# Patient Record
Sex: Male | Born: 1985 | Race: Black or African American | Hispanic: No | Marital: Married | Smoking: Current every day smoker
Health system: Southern US, Community
[De-identification: ages and names within clinical notes are randomized; demographics above are authoritative.]

## PROBLEM LIST (undated history)

## (undated) DIAGNOSIS — Z789 Other specified health status: Secondary | ICD-10-CM

---

## 2012-08-31 ENCOUNTER — Emergency Department (HOSPITAL_COMMUNITY): Payer: No Typology Code available for payment source

## 2012-08-31 ENCOUNTER — Encounter (HOSPITAL_COMMUNITY): Payer: Self-pay | Admitting: Emergency Medicine

## 2012-08-31 ENCOUNTER — Other Ambulatory Visit (HOSPITAL_COMMUNITY): Payer: Self-pay | Admitting: Emergency Medicine

## 2012-08-31 ENCOUNTER — Ambulatory Visit (HOSPITAL_COMMUNITY)
Admission: RE | Admit: 2012-08-31 | Discharge: 2012-08-31 | Disposition: A | Discharge status: Home / Self Care | Payer: No Typology Code available for payment source | Source: Ambulatory Visit | Attending: Emergency Medicine | Admitting: Emergency Medicine

## 2012-08-31 ENCOUNTER — Emergency Department (HOSPITAL_COMMUNITY)
Admission: EM | Admit: 2012-08-31 | Discharge: 2012-08-31 | Disposition: A | Discharge status: Home / Self Care | Payer: No Typology Code available for payment source | Attending: Emergency Medicine | Admitting: Emergency Medicine

## 2012-08-31 DIAGNOSIS — R05 Cough: Secondary | ICD-10-CM

## 2012-08-31 DIAGNOSIS — Y93I9 Activity, other involving external motion: Secondary | ICD-10-CM | POA: Insufficient documentation

## 2012-08-31 DIAGNOSIS — S161XXA Strain of muscle, fascia and tendon at neck level, initial encounter: Secondary | ICD-10-CM

## 2012-08-31 DIAGNOSIS — Y92410 Unspecified street and highway as the place of occurrence of the external cause: Secondary | ICD-10-CM | POA: Insufficient documentation

## 2012-08-31 DIAGNOSIS — F17200 Nicotine dependence, unspecified, uncomplicated: Secondary | ICD-10-CM | POA: Insufficient documentation

## 2012-08-31 DIAGNOSIS — S139XXA Sprain of joints and ligaments of unspecified parts of neck, initial encounter: Secondary | ICD-10-CM | POA: Insufficient documentation

## 2012-08-31 DIAGNOSIS — S46909A Unspecified injury of unspecified muscle, fascia and tendon at shoulder and upper arm level, unspecified arm, initial encounter: Secondary | ICD-10-CM | POA: Insufficient documentation

## 2012-08-31 DIAGNOSIS — S4980XA Other specified injuries of shoulder and upper arm, unspecified arm, initial encounter: Secondary | ICD-10-CM | POA: Insufficient documentation

## 2012-08-31 MED ORDER — TRAMADOL HCL 50 MG PO TABS: 50.0000 mg | ORAL_TABLET | Freq: Four times a day (QID) | ORAL | Status: DC | PRN

## 2012-08-31 MED ORDER — IBUPROFEN 800 MG PO TABS
800.0000 mg | ORAL_TABLET | Freq: Once | ORAL | Status: AC
  Administered 2012-08-31: 800 mg via ORAL
  Filled 2012-08-31: qty 1

## 2012-09-02 MED FILL — Acetaminophen Tab 325 MG: ORAL | Qty: 3 | Status: AC

## 2012-11-09 ENCOUNTER — Emergency Department (HOSPITAL_BASED_OUTPATIENT_CLINIC_OR_DEPARTMENT_OTHER)
Admission: EM | Admit: 2012-11-09 | Discharge: 2012-11-09 | Disposition: A | Discharge status: Home / Self Care | Payer: Self-pay | Attending: Emergency Medicine | Admitting: Emergency Medicine

## 2012-11-09 ENCOUNTER — Encounter (HOSPITAL_BASED_OUTPATIENT_CLINIC_OR_DEPARTMENT_OTHER): Payer: Self-pay | Admitting: *Deleted

## 2012-11-09 DIAGNOSIS — A599 Trichomoniasis, unspecified: Secondary | ICD-10-CM | POA: Insufficient documentation

## 2012-11-09 DIAGNOSIS — F17200 Nicotine dependence, unspecified, uncomplicated: Secondary | ICD-10-CM | POA: Insufficient documentation

## 2012-11-09 MED ORDER — METRONIDAZOLE 500 MG PO TABS
2000.0000 mg | ORAL_TABLET | Freq: Once | ORAL | Status: AC
  Administered 2012-11-09: 2000 mg via ORAL
  Filled 2012-11-09: qty 4

## 2012-11-09 NOTE — ED Notes (Signed)
MD at bedside. 

## 2012-11-09 NOTE — ED Provider Notes (Signed)
History     CSN: [id number]  Arrival date & time 11/09/12  0302   None     Chief Complaint  Patient presents with  . Exposure to STD     (Consider location/radiation/quality/duration/timing/severity/associated sxs/prior treatment) HPI This is a 9 or old male whose girlfriend was just tested positive for trichomoniasis here in the ED. He wishes to be treated. He is asymptomatic.  History reviewed. No pertinent past medical history.  History reviewed. No pertinent past surgical history.  No family history on file.  History  Substance Use Topics  . Smoking status: Current Every Day Smoker  . Smokeless tobacco: Not on file  . Alcohol Use: Yes      Review of Systems  All other systems reviewed and are negative.    Allergies  Review of patient's allergies indicates no known allergies.  Home Medications   Current Outpatient Rx  Name  Route  Sig  Dispense  Refill  . traMADol (ULTRAM) 50 MG tablet   Oral   Take 1 tablet (50 mg total) by mouth every 6 (six) hours as needed for pain.   15 tablet   0     BP 142/92  Pulse 66  Temp(Src) 98.4 F (36.9 C) (Oral)  Resp 18  Wt 190 lb (86.183 kg)  SpO2 98%  Physical Exam General: Well-developed, well-nourished male in no acute distress; appearance consistent with age of record HENT: normocephalic, atraumatic Eyes: Normal appearance Neck: supple Heart: regular rate and rhythm Lungs: Normal respiratory effort and excursion Abdomen: soft; nondistended; nontender GU: Tanner 4 male, circumcised; no urethral discharge Extremities: No deformity; full range of motion Neurologic: Awake, alert; motor function intact in all extremities and symmetric; no facial droop Skin: Warm and dry Psychiatric: Flat affect    ED Course  Procedures (including critical care time)    MDM          Hanley Seamen, MD 11/09/12 [phone]

## 2012-11-09 NOTE — ED Notes (Signed)
Pt's girlfriend tested positive for trichomonas tonight and pt would like to be treated for same.

## 2012-11-10 LAB — GC/CHLAMYDIA PROBE AMP
CT Probe RNA: NEGATIVE
GC Probe RNA: NEGATIVE

## 2012-11-17 ENCOUNTER — Encounter (HOSPITAL_BASED_OUTPATIENT_CLINIC_OR_DEPARTMENT_OTHER): Payer: Self-pay | Admitting: *Deleted

## 2012-11-17 ENCOUNTER — Emergency Department (HOSPITAL_BASED_OUTPATIENT_CLINIC_OR_DEPARTMENT_OTHER)
Admission: EM | Admit: 2012-11-17 | Discharge: 2012-11-17 | Disposition: A | Discharge status: Home / Self Care | Payer: Self-pay | Attending: Emergency Medicine | Admitting: Emergency Medicine

## 2012-11-17 DIAGNOSIS — R05 Cough: Secondary | ICD-10-CM | POA: Insufficient documentation

## 2012-11-17 DIAGNOSIS — J309 Allergic rhinitis, unspecified: Secondary | ICD-10-CM | POA: Insufficient documentation

## 2012-11-17 DIAGNOSIS — F17200 Nicotine dependence, unspecified, uncomplicated: Secondary | ICD-10-CM | POA: Insufficient documentation

## 2012-11-17 DIAGNOSIS — R059 Cough, unspecified: Secondary | ICD-10-CM | POA: Insufficient documentation

## 2012-11-17 NOTE — ED Notes (Signed)
Pt states he has problems with his allergies, but they have been really bad today. No relief with OTC meds.

## 2012-11-17 NOTE — ED Provider Notes (Signed)
History     CSN: [id number]  Arrival date & time 11/17/12  0011   First MD Initiated Contact with Patient 11/17/12 0211      Chief Complaint  Patient presents with  . Allergies     The history is provided by the patient.  pt reports allergies Onset - 1 day ago Course - worsening Improved by - nothing Worsened by - nothing  Pt reports nasal congestion, sinus pressure, mild cough and lacrimation for one day Not responding to claritin  PMH - none  History reviewed. No pertinent past surgical history.  History reviewed. No pertinent family history.  History  Substance Use Topics  . Smoking status: Current Every Day Smoker  . Smokeless tobacco: Not on file  . Alcohol Use: Yes      Review of Systems  HENT: Positive for congestion.   Respiratory: Positive for cough.     Allergies  Review of patient's allergies indicates no known allergies.  Home Medications   Current Outpatient Rx  Name  Route  Sig  Dispense  Refill  . traMADol (ULTRAM) 50 MG tablet   Oral   Take 1 tablet (50 mg total) by mouth every 6 (six) hours as needed for pain.   15 tablet   0     BP 145/93  Pulse 67  Temp(Src) 98.1 F (36.7 C) (Oral)  Resp 18  Ht 5\' 10"  (1.778 m)  Wt 190 lb (86.183 kg)  BMI 27.26 kg/m2  SpO2 98%  Physical Exam CONSTITUTIONAL: Well developed/well nourished HEAD: Normocephalic/atraumatic EYES: EOMI/PERRL ENMT: Mucous membranes moist, nasal congestion noted.  Nasal mucosa boggy in appearance NECK: supple no meningeal signs CV: S1/S2 noted, no murmurs/rubs/gallops noted LUNGS: Lungs are clear to auscultation bilaterally, no apparent distress ABDOMEN: soft, nontender, no rebound or guarding NEURO: Pt is awake/alert, moves all extremitiesx4 EXTREMITIES: pulses normal, full ROM SKIN: warm, color normal PSYCH: no abnormalities of mood noted  ED Course  Procedures  1. Allergic rhinitis       MDM  Nursing notes including past medical history and social  history reviewed and considered in documentation  Recommend flonase, report he is already using without relief He requests a "shot" for his symptoms but I told him there was not a shot to be given Stable for d/c        Joya Gaskins, MD 11/17/12 [phone]

## 2013-07-08 ENCOUNTER — Emergency Department (HOSPITAL_BASED_OUTPATIENT_CLINIC_OR_DEPARTMENT_OTHER)
Admission: EM | Admit: 2013-07-08 | Discharge: 2013-07-08 | Disposition: A | Discharge status: Home / Self Care | Payer: Self-pay | Attending: Emergency Medicine | Admitting: Emergency Medicine

## 2013-07-08 ENCOUNTER — Encounter (HOSPITAL_BASED_OUTPATIENT_CLINIC_OR_DEPARTMENT_OTHER): Payer: Self-pay | Admitting: Emergency Medicine

## 2013-07-08 ENCOUNTER — Emergency Department (HOSPITAL_BASED_OUTPATIENT_CLINIC_OR_DEPARTMENT_OTHER): Payer: Self-pay

## 2013-07-08 DIAGNOSIS — J3489 Other specified disorders of nose and nasal sinuses: Secondary | ICD-10-CM | POA: Insufficient documentation

## 2013-07-08 DIAGNOSIS — IMO0001 Reserved for inherently not codable concepts without codable children: Secondary | ICD-10-CM | POA: Insufficient documentation

## 2013-07-08 DIAGNOSIS — J029 Acute pharyngitis, unspecified: Secondary | ICD-10-CM | POA: Insufficient documentation

## 2013-07-08 DIAGNOSIS — R52 Pain, unspecified: Secondary | ICD-10-CM | POA: Insufficient documentation

## 2013-07-08 DIAGNOSIS — B349 Viral infection, unspecified: Secondary | ICD-10-CM

## 2013-07-08 DIAGNOSIS — F17200 Nicotine dependence, unspecified, uncomplicated: Secondary | ICD-10-CM | POA: Insufficient documentation

## 2013-07-08 DIAGNOSIS — R630 Anorexia: Secondary | ICD-10-CM | POA: Insufficient documentation

## 2013-07-08 DIAGNOSIS — R509 Fever, unspecified: Secondary | ICD-10-CM | POA: Insufficient documentation

## 2013-07-08 DIAGNOSIS — B9789 Other viral agents as the cause of diseases classified elsewhere: Secondary | ICD-10-CM | POA: Insufficient documentation

## 2013-07-08 LAB — COMPREHENSIVE METABOLIC PANEL
ALT: 21 U/L (ref 0–53)
AST: 19 U/L (ref 0–37)
Albumin: 4.1 g/dL (ref 3.5–5.2)
CO2: 25 mEq/L (ref 19–32)
Calcium: 9.6 mg/dL (ref 8.4–10.5)
Chloride: 101 mEq/L (ref 96–112)
Creatinine, Ser: 1.2 mg/dL (ref 0.50–1.35)
Sodium: 137 mEq/L (ref 135–145)
Total Bilirubin: 0.3 mg/dL (ref 0.3–1.2)

## 2013-07-08 LAB — CBC WITH DIFFERENTIAL/PLATELET
Basophils Absolute: 0 10*3/uL (ref 0.0–0.1)
Basophils Relative: 0 % (ref 0–1)
HCT: 41.1 % (ref 39.0–52.0)
Lymphocytes Relative: 11 % — ABNORMAL LOW (ref 12–46)
MCHC: 35 g/dL (ref 30.0–36.0)
Monocytes Absolute: 1.2 10*3/uL — ABNORMAL HIGH (ref 0.1–1.0)
Neutro Abs: 12.8 10*3/uL — ABNORMAL HIGH (ref 1.7–7.7)
Platelets: 220 10*3/uL (ref 150–400)
RDW: 12.5 % (ref 11.5–15.5)
WBC: 16 10*3/uL — ABNORMAL HIGH (ref 4.0–10.5)

## 2013-07-08 MED ORDER — PROMETHAZINE HCL 25 MG PO TABS: 25.0000 mg | ORAL_TABLET | Freq: Four times a day (QID) | ORAL | Status: DC | PRN

## 2013-07-08 MED ORDER — KETOROLAC TROMETHAMINE 30 MG/ML IJ SOLN
30.0000 mg | Freq: Once | INTRAMUSCULAR | Status: AC
  Administered 2013-07-08: 30 mg via INTRAVENOUS
  Filled 2013-07-08: qty 1

## 2013-07-08 MED ORDER — ONDANSETRON HCL 4 MG/2ML IJ SOLN
4.0000 mg | Freq: Once | INTRAMUSCULAR | Status: AC
  Administered 2013-07-08: 4 mg via INTRAVENOUS
  Filled 2013-07-08: qty 2

## 2013-07-08 MED ORDER — ALBUTEROL SULFATE HFA 108 (90 BASE) MCG/ACT IN AERS
1.0000 | INHALATION_SPRAY | RESPIRATORY_TRACT | Status: DC | PRN
  Administered 2013-07-08: 2 via RESPIRATORY_TRACT
  Filled 2013-07-08: qty 6.7

## 2013-07-08 MED ORDER — SODIUM CHLORIDE 0.9 % IV BOLUS (SEPSIS)
1000.0000 mL | Freq: Once | INTRAVENOUS | Status: AC
  Administered 2013-07-08: 1000 mL via INTRAVENOUS

## 2013-07-08 NOTE — ED Provider Notes (Signed)
CSN: [id number]     Arrival date & time 07/08/13  1740 History  This chart was scribed for Bill Bucco, MD by Landis Gandy, ED Scribe. This patient was seen in room MH06/MH06 and the patient's care was started at 7:02 PM   Chief Complaint  Patient presents with  . Emesis  . Sore Throat     The history is provided by the patient. No language interpreter was used.   HPI Comments: Bill Zuniga is a 27 y.o. male who presents to the Emergency Department complaining of a constant, gradually worsening sore throat, onset two days ago. Pt states his associated symptoms are emesis, cough, fever, congestion, generalized body aches and rhinorrhea. He denies SOB. Pt states he did not get a flu shot this year, and has not had any recent sick contacts.  .   History reviewed. No pertinent past medical history. History reviewed. No pertinent past surgical history. History reviewed. No pertinent family history. History  Substance Use Topics  . Smoking status: Current Every Day Smoker  . Smokeless tobacco: Not on file  . Alcohol Use: Yes    Review of Systems  Constitutional: Positive for fever and appetite change. Negative for chills, diaphoresis and fatigue.  HENT: Positive for rhinorrhea and sore throat. Negative for congestion and sneezing.   Eyes: Negative.   Respiratory: Positive for cough. Negative for chest tightness and shortness of breath.   Cardiovascular: Negative for chest pain and leg swelling.  Gastrointestinal: Positive for vomiting. Negative for nausea, abdominal pain, diarrhea and blood in stool.  Genitourinary: Negative for frequency, hematuria, flank pain and difficulty urinating.  Musculoskeletal: Positive for myalgias. Negative for arthralgias and back pain.  Skin: Negative for rash.  Neurological: Negative for dizziness, speech difficulty, weakness, numbness and headaches.    Allergies  Review of patient's allergies indicates no known allergies.  Home Medications    Current Outpatient Rx  Name  Route  Sig  Dispense  Refill  . traMADol (ULTRAM) 50 MG tablet   Oral   Take 1 tablet (50 mg total) by mouth every 6 (six) hours as needed for pain.   15 tablet   0    Triage vitals: BP 123/70  Pulse 85  Temp(Src) 98.9 F (37.2 C) (Oral)  Resp 18 Physical Exam  Constitutional: He is oriented to person, place, and time. He appears well-developed and well-nourished.  HENT:  Head: Normocephalic and atraumatic.  Right Ear: External ear normal.  Left Ear: External ear normal.  Mouth/Throat: Oropharynx is clear and moist.  Mild erythema of posterior pharnyx.  Uvula midline  Eyes: Pupils are equal, round, and reactive to light.  Neck: Normal range of motion. Neck supple.  Cardiovascular: Normal rate, regular rhythm and normal heart sounds.   Pulmonary/Chest: Effort normal. No respiratory distress. He has no rales. He exhibits no tenderness.  Rhonchi bilaterally  Abdominal: Soft. Bowel sounds are normal. There is no tenderness. There is no rebound and no guarding.  Musculoskeletal: Normal range of motion. He exhibits no edema.  Lymphadenopathy:    He has no cervical adenopathy.  Neurological: He is alert and oriented to person, place, and time.  Skin: Skin is warm and dry. No rash noted.  Psychiatric: He has a normal mood and affect.    ED Course  Procedures (including critical care time) DIAGNOSTIC STUDIES: Oxygen Saturation is 94% on RA, normal by my interpretation.    COORDINATION OF CARE: 7:08 PM- CXR and diagnostic labs ordered. Toradol, Zofran and IV fluids  ordered as well.  Pt advised of plan for treatment and pt agrees.  Labs Review Results for orders placed during the hospital encounter of 07/08/13  RAPID STREP SCREEN      Result Value Range   Streptococcus, Group A Screen (Direct) NEGATIVE  NEGATIVE  CBC WITH DIFFERENTIAL      Result Value Range   WBC 16.0 (*) 4.0 - 10.5 K/uL   RBC 4.64  4.22 - 5.81 MIL/uL   Hemoglobin 14.4  13.0  - 17.0 g/dL   HCT 14.7  82.9 - 56.2 %   MCV 88.6  78.0 - 100.0 fL   MCH 31.0  26.0 - 34.0 pg   MCHC 35.0  30.0 - 36.0 g/dL   RDW 13.0  86.5 - 78.4 %   Platelets 220  150 - 400 K/uL   Neutrophils Relative % 80 (*) 43 - 77 %   Neutro Abs 12.8 (*) 1.7 - 7.7 K/uL   Lymphocytes Relative 11 (*) 12 - 46 %   Lymphs Abs 1.7  0.7 - 4.0 K/uL   Monocytes Relative 8  3 - 12 %   Monocytes Absolute 1.2 (*) 0.1 - 1.0 K/uL   Eosinophils Relative 1  0 - 5 %   Eosinophils Absolute 0.2  0.0 - 0.7 K/uL   Basophils Relative 0  0 - 1 %   Basophils Absolute 0.0  0.0 - 0.1 K/uL  COMPREHENSIVE METABOLIC PANEL      Result Value Range   Sodium 137  135 - 145 mEq/L   Potassium 3.8  3.5 - 5.1 mEq/L   Chloride 101  96 - 112 mEq/L   CO2 25  19 - 32 mEq/L   Glucose, Bld 115 (*) 70 - 99 mg/dL   BUN 11  6 - 23 mg/dL   Creatinine, Ser 6.96  0.50 - 1.35 mg/dL   Calcium 9.6  8.4 - 29.5 mg/dL   Total Protein 8.4 (*) 6.0 - 8.3 g/dL   Albumin 4.1  3.5 - 5.2 g/dL   AST 19  0 - 37 U/L   ALT 21  0 - 53 U/L   Alkaline Phosphatase 102  39 - 117 U/L   Total Bilirubin 0.3  0.3 - 1.2 mg/dL   GFR calc non Af Amer 82 (*) >90 mL/min   GFR calc Af Amer >90  >90 mL/min   Dg Chest 2 View  07/08/2013   CLINICAL DATA:  Cough.  Fever.  Nausea and vomiting.  EXAM: CHEST  2 VIEW  COMPARISON:  08/31/2012  FINDINGS: The heart size and mediastinal contours are within normal limits. Both lungs are clear. The visualized skeletal structures are unremarkable.  IMPRESSION: No active cardiopulmonary disease.   Electronically Signed   By: Myles Rosenthal M.D.   On: 07/08/2013 19:52      EKG Interpretation   None       MDM   1. Viral syndrome    Patient with cough sore throat myalgias. He has some mild wheezing on exam with some rhonchi. His chest x-ray is clear. He is otherwise well-appearing and has normal oxygen saturations. He was discharged home with an albuterol MDI as well as a prescription for Phenergan to use for nausea. He's  advised to followup with her primary care physician or return here as needed for any ongoing symptoms.  I personally performed the services described in this documentation, which was scribed in my presence.  The recorded information has been reviewed and considered.  Bill Bucco, MD 07/08/13 2000

## 2013-07-08 NOTE — Patient Instructions (Signed)
Instructed patient on the proper use of administering albuteral mdi via aerochamber patient tolerated well 

## 2013-07-08 NOTE — ED Notes (Signed)
Pt amb to triage with quick steady gait in nad. Pt reports sore throat and fever x yesterday.

## 2013-07-10 LAB — CULTURE, GROUP A STREP

## 2013-08-10 ENCOUNTER — Emergency Department (HOSPITAL_BASED_OUTPATIENT_CLINIC_OR_DEPARTMENT_OTHER)
Admission: EM | Admit: 2013-08-10 | Discharge: 2013-08-10 | Discharge status: Against Medical Advice | Payer: No Typology Code available for payment source | Attending: Emergency Medicine | Admitting: Emergency Medicine

## 2013-08-10 ENCOUNTER — Encounter (HOSPITAL_BASED_OUTPATIENT_CLINIC_OR_DEPARTMENT_OTHER): Payer: Self-pay | Admitting: Emergency Medicine

## 2013-08-10 DIAGNOSIS — Y9389 Activity, other specified: Secondary | ICD-10-CM | POA: Insufficient documentation

## 2013-08-10 DIAGNOSIS — F17200 Nicotine dependence, unspecified, uncomplicated: Secondary | ICD-10-CM | POA: Insufficient documentation

## 2013-08-10 DIAGNOSIS — W268XXA Contact with other sharp object(s), not elsewhere classified, initial encounter: Secondary | ICD-10-CM | POA: Insufficient documentation

## 2013-08-10 DIAGNOSIS — S61209A Unspecified open wound of unspecified finger without damage to nail, initial encounter: Secondary | ICD-10-CM | POA: Insufficient documentation

## 2013-08-10 DIAGNOSIS — Y929 Unspecified place or not applicable: Secondary | ICD-10-CM | POA: Insufficient documentation

## 2013-08-10 NOTE — ED Notes (Signed)
Patient stated that he had to go to work and that he needed to leave.

## 2013-08-10 NOTE — ED Notes (Signed)
Patient here with laceration to left thumb after cutting same on broken glass this morning, no bleeding on arrival

## 2014-08-02 ENCOUNTER — Emergency Department (HOSPITAL_BASED_OUTPATIENT_CLINIC_OR_DEPARTMENT_OTHER): Payer: Self-pay

## 2014-08-02 ENCOUNTER — Emergency Department (HOSPITAL_BASED_OUTPATIENT_CLINIC_OR_DEPARTMENT_OTHER)
Admission: EM | Admit: 2014-08-02 | Discharge: 2014-08-02 | Disposition: A | Discharge status: Home / Self Care | Payer: Self-pay | Attending: Emergency Medicine | Admitting: Emergency Medicine

## 2014-08-02 ENCOUNTER — Encounter (HOSPITAL_BASED_OUTPATIENT_CLINIC_OR_DEPARTMENT_OTHER): Payer: Self-pay | Admitting: Emergency Medicine

## 2014-08-02 DIAGNOSIS — W19XXXA Unspecified fall, initial encounter: Secondary | ICD-10-CM

## 2014-08-02 DIAGNOSIS — Z720 Tobacco use: Secondary | ICD-10-CM | POA: Insufficient documentation

## 2014-08-02 DIAGNOSIS — Y998 Other external cause status: Secondary | ICD-10-CM | POA: Insufficient documentation

## 2014-08-02 DIAGNOSIS — Y9389 Activity, other specified: Secondary | ICD-10-CM | POA: Insufficient documentation

## 2014-08-02 DIAGNOSIS — S8992XA Unspecified injury of left lower leg, initial encounter: Secondary | ICD-10-CM | POA: Insufficient documentation

## 2014-08-02 DIAGNOSIS — Y929 Unspecified place or not applicable: Secondary | ICD-10-CM | POA: Insufficient documentation

## 2014-08-02 DIAGNOSIS — W010XXA Fall on same level from slipping, tripping and stumbling without subsequent striking against object, initial encounter: Secondary | ICD-10-CM | POA: Insufficient documentation

## 2014-08-02 MED ORDER — IBUPROFEN 800 MG PO TABS
800.0000 mg | ORAL_TABLET | Freq: Once | ORAL | Status: AC
  Administered 2014-08-02: 800 mg via ORAL
  Filled 2014-08-02: qty 1

## 2014-08-02 NOTE — ED Notes (Addendum)
PT presents to ED with complaints of left knee pain after he tripped and fell today. PT landed on concrete.

## 2014-08-02 NOTE — ED Notes (Signed)
Ice pack given

## 2014-08-02 NOTE — ED Provider Notes (Signed)
CSN: [id number]     Arrival date & time 08/02/14  1225 History   First MD Initiated Contact with Patient 08/02/14 1349     Chief Complaint  Patient presents with  . Knee Injury     (Consider location/radiation/quality/duration/timing/severity/associated sxs/prior Treatment) HPI  29 year old male who comes in today complaining of left knee pain after he tripped and fell coming off of a scooter. He describes hyperflexing. his left knee. He complains of diffuse severe pain throughout the knee. He states he has been unable to put weight on it. He denies any other injury. He denies striking his head. He has not taken any medication for this.  History reviewed. No pertinent past medical history. History reviewed. No pertinent past surgical history. No family history on file. History  Substance Use Topics  . Smoking status: Current Every Day Smoker  . Smokeless tobacco: Not on file  . Alcohol Use: Yes    Review of Systems  All other systems reviewed and are negative.     Allergies  Review of patient's allergies indicates no known allergies.  Home Medications   Prior to Admission medications   Not on File   BP 131/86 mmHg  Pulse 82  Temp(Src) 97.8 F (36.6 C) (Oral)  Resp 18  Ht 5\' 10"  (1.778 m)  Wt 210 lb (95.255 kg)  BMI 30.13 kg/m2  SpO2 99% Physical Exam  Constitutional: He is oriented to person, place, and time. He appears well-developed.  HENT:  Head: Normocephalic and atraumatic.  Right Ear: External ear normal.  Left Ear: External ear normal.  Nose: Nose normal.  Eyes: EOM are normal.  Pulmonary/Chest: Effort normal.  Musculoskeletal: Normal range of motion.       Legs: Patient with mild swelling anterior left knee with diffuse tenderness to palpation. Patellar ligament is intact by palpation and patient is able to hold his leg extended although complains of pain when attempting to extend or flex his knee.  Neurological: He is alert and oriented to person,  place, and time.  Skin: Skin is warm and dry.  Psychiatric: He has a normal mood and affect. His behavior is normal.  Nursing note and vitals reviewed.   ED Course  Procedures (including critical care time) Labs Review Labs Reviewed - No data to display  Imaging Review Dg Knee Complete 4 Views Left  08/02/2014   CLINICAL DATA:  Twisting injury of the left knee.  Medial pain.  EXAM: LEFT KNEE - COMPLETE 4+ VIEW  COMPARISON:  None.  FINDINGS: There is no evidence of fracture, dislocation, or joint effusion. There is no evidence of arthropathy or other focal bone abnormality. Soft tissues are unremarkable.  IMPRESSION: Negative.   Electronically Signed   By: Elige KoHetal  Patel   On: 08/02/2014 14:04     EKG Interpretation None      MDM   Final diagnoses:  Left knee injury, initial encounter    29 year old male with fall and hyperflexion injury of left knee. Patient with left knee tenderness and possible patellar ligament injury although clinically appears to be intact. Land place patient in knee immobilizer and to use crutches. He is given referral to follow-up with Dr. Pearletha ForgeHudnall in 1-2 days for further evaluation when swelling has decreased. And voices understanding.    Hilario Quarryanielle S Arcenia Scarbro, MD 08/02/14 [phone]

## 2014-08-02 NOTE — Discharge Instructions (Signed)
Please use immobilizer, cold therapy, and keep leg elevated.  Recheck with Dr. Pearletha ForgeHudnall early this week for further evaluation after pain and swelling are reduced.

## 2016-05-20 ENCOUNTER — Emergency Department (HOSPITAL_BASED_OUTPATIENT_CLINIC_OR_DEPARTMENT_OTHER): Payer: Self-pay

## 2016-05-20 ENCOUNTER — Emergency Department (HOSPITAL_BASED_OUTPATIENT_CLINIC_OR_DEPARTMENT_OTHER)
Admission: EM | Admit: 2016-05-20 | Discharge: 2016-05-20 | Disposition: A | Discharge status: Home / Self Care | Payer: Self-pay | Attending: Emergency Medicine | Admitting: Emergency Medicine

## 2016-05-20 ENCOUNTER — Encounter (HOSPITAL_BASED_OUTPATIENT_CLINIC_OR_DEPARTMENT_OTHER): Payer: Self-pay | Admitting: Emergency Medicine

## 2016-05-20 DIAGNOSIS — S92321A Displaced fracture of second metatarsal bone, right foot, initial encounter for closed fracture: Secondary | ICD-10-CM | POA: Insufficient documentation

## 2016-05-20 DIAGNOSIS — Y999 Unspecified external cause status: Secondary | ICD-10-CM | POA: Insufficient documentation

## 2016-05-20 DIAGNOSIS — S92214A Nondisplaced fracture of cuboid bone of right foot, initial encounter for closed fracture: Secondary | ICD-10-CM | POA: Insufficient documentation

## 2016-05-20 DIAGNOSIS — F17200 Nicotine dependence, unspecified, uncomplicated: Secondary | ICD-10-CM | POA: Insufficient documentation

## 2016-05-20 DIAGNOSIS — W500XXA Accidental hit or strike by another person, initial encounter: Secondary | ICD-10-CM | POA: Insufficient documentation

## 2016-05-20 DIAGNOSIS — S92331A Displaced fracture of third metatarsal bone, right foot, initial encounter for closed fracture: Secondary | ICD-10-CM | POA: Insufficient documentation

## 2016-05-20 DIAGNOSIS — S92909A Unspecified fracture of unspecified foot, initial encounter for closed fracture: Secondary | ICD-10-CM

## 2016-05-20 DIAGNOSIS — Y9367 Activity, basketball: Secondary | ICD-10-CM | POA: Insufficient documentation

## 2016-05-20 DIAGNOSIS — Y929 Unspecified place or not applicable: Secondary | ICD-10-CM | POA: Insufficient documentation

## 2016-05-20 MED ORDER — OXYCODONE-ACETAMINOPHEN 5-325 MG PO TABS
1.0000 | ORAL_TABLET | Freq: Once | ORAL | Status: AC
  Administered 2016-05-20: 1 via ORAL
  Filled 2016-05-20: qty 1

## 2016-05-20 MED ORDER — OXYCODONE-ACETAMINOPHEN 5-325 MG PO TABS: 1.0000 | ORAL_TABLET | ORAL | 0 refills | Status: DC | PRN

## 2016-05-20 NOTE — ED Provider Notes (Signed)
[facility]   CSN: [id number] Arrival date & time: 05/20/16  0905     History   Chief Complaint Chief Complaint  Patient presents with  . Foot Pain    HPI Bill Zuniga is a 30 y.o. male.  The history is provided by the patient and medical records.  Foot Pain     30 year old male with no significant past medical history presenting to the ED for a right foot injury.  Patient states he was playing basketball last night and fell. He states 2 other guys stepped on his right foot. No head injury loss of consciousness. States he is had pain and swelling of his right foot since. He is not able to ambulate or bear weight on his foot. Denies any numbness or weakness of the right foot. No prior foot or ankle injuries. Has applied ice with mild relief.  History reviewed. No pertinent past medical history.  There are no active problems to display for this patient.   History reviewed. No pertinent surgical history.     Home Medications    Prior to Admission medications   Not on File    Family History No family history on file.  Social History Social History  Substance Use Topics  . Smoking status: Current Every Day Smoker  . Smokeless tobacco: Never Used  . Alcohol use Yes     Allergies   Review of patient's allergies indicates no known allergies.   Review of Systems Review of Systems  Musculoskeletal: Positive for arthralgias.  All other systems reviewed and are negative.    Physical Exam Updated Vital Signs BP (!) 143/104 (BP Location: Right Arm)   Pulse 110   Temp 98.7 F (37.1 C) (Oral)   Resp 18   Ht 5\' 10"  (1.778 m)   Wt 97.5 kg   SpO2 98%   BMI 30.85 kg/m   Physical Exam  Constitutional: He is oriented to person, place, and time. He appears well-developed and well-nourished.  HENT:  Head: Normocephalic and atraumatic.  Mouth/Throat: Oropharynx is clear and moist.  Eyes: Conjunctivae and EOM are normal. Pupils are  equal, round, and reactive to light.  Neck: Normal range of motion.  Cardiovascular: Normal rate, regular rhythm and normal heart sounds.   Pulmonary/Chest: Effort normal and breath sounds normal. No respiratory distress. He has no wheezes.  Abdominal: Soft. Bowel sounds are normal. There is no tenderness. There is no rebound.  Musculoskeletal: Normal range of motion.       Right ankle: He exhibits swelling. Tenderness.       Feet:  Right foot with diffuse swelling along dorsal foot; tenderness along 2nd and 3rd metatarsals; moving all toes normally; DP pulses intact; normal sensation throughout Right ankle normal  Neurological: He is alert and oriented to person, place, and time.  Skin: Skin is warm and dry.  Psychiatric: He has a normal mood and affect.  Nursing note and vitals reviewed.    ED Treatments / Results  Labs (all labs ordered are listed, but only abnormal results are displayed) Labs Reviewed - No data to display  EKG  EKG Interpretation None       Radiology Ct Foot Right Wo Contrast  Result Date: 05/20/2016 CLINICAL DATA:  Dorsal foot swelling after a basketball injury last night. Fractures at the Lisfranc joint. EXAM: CT OF THE RIGHT FOOT WITHOUT CONTRAST TECHNIQUE: Multidetector CT imaging of the right foot was performed according to the standard protocol. Multiplanar CT image reconstructions  were also generated. COMPARISON:  Radiographs dated 05/20/2016 FINDINGS: Bones/Joint/Cartilage There are fractures of the cuboid, medial, mid, and lateral cuneiforms, and of the bases of the second and third metatarsals. The plantar aspect of the bases of the second and third metatarsals are comminuted and impacted, most likely due to a hyper flexion injury of the midfoot. There is an impaction fracture of distal lateral aspect of the cuboid with an oblique fracture extending through the body of the cuboid. The calcaneus and talus and navicular are intact. Distal metatarsals and  phalanges are intact. Ligaments Suboptimally assessed by CT. Intraosseous ligaments are disrupted between the second and third metatarsals and the insertion of mid and plantar aspects of Lisfranc ligament is avulsed from the base of the second metatarsal which creates instability of the Lisfranc joint. IMPRESSION: Multiple fractures of the midfoot with disruptions of the Lisfranc joints as described including between the medial cuneiform and the base of the second metatarsal, between the second and third metatarsal bases and between the third and fourth metatarsal bases. Electronically Signed   By: Francene BoyersJames  Maxwell M.D.   On: 05/20/2016 12:48   Dg Foot Complete Right  Result Date: 05/20/2016 CLINICAL DATA:  Right metatarsal pain since 0200 hours EXAM: RIGHT FOOT COMPLETE - 3+ VIEW COMPARISON:  None. FINDINGS: There is a mildly displaced fracture at the medial base of the second metatarsal. There is a subtle fracture at the medial base of the third metatarsal with slight lateral subluxation relative to the lateral cuneiform. There is irregularity along the distal lateral aspect of the cuboid concerning for a nondisplaced fracture. There is no evidence of arthropathy or other focal bone abnormality. Soft tissue swelling along the dorsal aspect of the foot. IMPRESSION: 1. Overall findings are concerning for Lisfranc fracture-subluxation off the right foot. Recommend orthopedic surgery consultation. Electronically Signed   By: Elige KoHetal  Patel   On: 05/20/2016 09:56    Procedures Procedures (including critical care time)  Medications Ordered in ED Medications - No data to display   Initial Impression / Assessment and Plan / ED Course  I have reviewed the triage vital signs and the nursing notes.  Pertinent labs & imaging results that were available during my care of the patient were reviewed by me and considered in my medical decision making (see chart for details).  Clinical Course   [age] year old male  here with right foot injury last night while playing basketball. He fell and 2 other men stepped on his right foot. Median onset of pain and swelling. Unable to bear weight on affected foot. On exam he does have diffuse swelling of the dorsal right foot, however compartments are soft and easily compressible. His foot is neurovascularly intact. X-ray concerning for Lisfranc fracture, therefore CT obtained revealing multiple fractures of the midfoot with disruptions of the Lisfranc joints. Case discussed with on-call orthopedics, Dr. Magnus IvanBlackman,-- recommends well-padded posterior splint, nonweightbearing with crutches, ice and elevation at home. He will see patient in clinic next week, likely plan for operative repair.  Discussed plan with patient, he acknowledged understanding and agreed with plan of care.  Return precautions given for new or worsening symptoms.  Final Clinical Impressions(s) / ED Diagnoses   Final diagnoses:  Multiple closed fractures of foot, initial encounter    New Prescriptions New Prescriptions   OXYCODONE-ACETAMINOPHEN (PERCOCET/ROXICET) 5-325 MG TABLET    Take 1 tablet by mouth every 4 (four) hours as needed.     Garlon HatchetLisa M An Schnabel, PA-C 05/20/16 1409  Melene Planan Floyd, DO 05/20/16 [phone]

## 2016-05-20 NOTE — ED Notes (Signed)
BP measured x2. Per pt's wife, pt has elevated BP readings every time he is seen. She is trying to get him to see a PCP and get medication to control it.

## 2016-05-20 NOTE — Discharge Instructions (Signed)
Take the prescribed medication as directed.   Try to stay off your foot.  Ice and elevate foot at home to help with swelling. Follow-up with Dr. Magnus IvanBlackman-- call his office Monday to make appt for next week. Return to the ED for new or worsening symptoms.

## 2016-05-20 NOTE — ED Triage Notes (Signed)
R foot pain following an injury playing basketball last night. Swelling noted.

## 2016-05-23 ENCOUNTER — Encounter (INDEPENDENT_AMBULATORY_CARE_PROVIDER_SITE_OTHER): Payer: Self-pay | Admitting: Sports Medicine

## 2016-05-23 ENCOUNTER — Ambulatory Visit (INDEPENDENT_AMBULATORY_CARE_PROVIDER_SITE_OTHER): Payer: Self-pay | Admitting: Orthopedic Surgery

## 2016-05-23 ENCOUNTER — Other Ambulatory Visit (INDEPENDENT_AMBULATORY_CARE_PROVIDER_SITE_OTHER): Payer: Self-pay | Admitting: Family

## 2016-05-23 ENCOUNTER — Ambulatory Visit (INDEPENDENT_AMBULATORY_CARE_PROVIDER_SITE_OTHER): Payer: Self-pay | Admitting: Sports Medicine

## 2016-05-23 VITALS — BP 146/93 | HR 86 | Ht 70.0 in | Wt 215.0 lb

## 2016-05-23 DIAGNOSIS — S93324A Dislocation of tarsometatarsal joint of right foot, initial encounter: Secondary | ICD-10-CM

## 2016-05-23 DIAGNOSIS — S93324S Dislocation of tarsometatarsal joint of right foot, sequela: Secondary | ICD-10-CM

## 2016-05-23 DIAGNOSIS — Z720 Tobacco use: Secondary | ICD-10-CM

## 2016-05-23 MED ORDER — OXYCODONE-ACETAMINOPHEN 5-325 MG PO TABS
1.0000 | ORAL_TABLET | ORAL | 0 refills | Status: DC | PRN
Start: 2016-05-23 — End: 2016-06-08

## 2016-05-23 NOTE — Progress Notes (Signed)
Bill BoatmanRuben Zuniga - 30 y.o. male MRN [id number]  Date of birth: 03-23-1986  Office Visit Note: Visit Date: 05/23/2016 PCP: No PCP Per Patient Referred by: No ref. provider found  Subjective: Chief Complaint  Patient presents with  . Right Foot - Fracture   HPI: Patient injured right foot on Saturday 05/20/16.  [facility]  X-rays taken.  Currently taking Percocet.  Patient currently in a splint.  Reports a hyperextension & axial load through his foot while being struck playing basketball. Mediate onset of pain. Swelling has improved. No pain out of proportion. No prior foot issues. Currently smoking 6-7 cigarettes per day.    ROS Otherwise per HPI.  Assessment & Plan: Visit Diagnoses:  1. Lisfranc dislocation, right, initial encounter   2. Tobacco use     Plan: Findings:  Surgical intervention is warranted at this time given the extensive changes. Dr. Lajoyce Cornersuda to see the patient today & schedule surgery for tomorrow. Spent extensive time discussing the importance of smoking cessation & avoidance of smoking for 3 months following surgery. Patient voices understanding of the risks & wishes to proceed. He will need to be nonweightbearing for 2 weeks until the incisions healed. We will plan follow-up within one week postoperatively & Dr. Audrie Liauda's clinic for postoperative check. Pain medicine provided today to be taken postoperatively. Recommend frequent elevation & icing.    Meds & Orders:  Meds ordered this encounter  Medications  . oxyCODONE-acetaminophen (PERCOCET/ROXICET) 5-325 MG tablet    Sig: Take 1 tablet by mouth every 4 (four) hours as needed.    Dispense:  30 tablet    Refill:  0   No orders of the defined types were placed in this encounter.   Follow-up: Return in about 1 week (around 05/30/2016) for post op with Dr. Lajoyce Cornersuda.   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 Lisfranc fracture dislocation  on 05/20/2016 while playing basketball  He reports that he has been smoking.  He has never used smokeless tobacco. No results for input(s): HGBA1C, LABURIC in the last 8760 hours.  Objective:  VS:  HT:5\' 10"  (177.8 cm)   WT:215 lb (97.5 kg)  BMI:30.9    BP:(!) 146/93  HR:86bpm  TEMP: ( )  RESP:  Physical Exam  Constitutional:  Adult male. In no acute distress. Alert & appropriately interactive. Right foot is mildly swollen with DP & PT pulses that are 2+/4. He has marked pain across the dorsum of the midfoot. Pain with any type of resisted abduction. His ankle dorsiflexion & plantar flexion strength is intact. Sensation is intact to light touch.    Ortho Exam Imaging: Ct Foot Right Wo Contrast  Result Date: 05/20/2016 CLINICAL DATA:  Dorsal foot swelling after a basketball injury last night. Fractures at the Lisfranc joint. EXAM: CT OF THE RIGHT FOOT WITHOUT CONTRAST TECHNIQUE: Multidetector CT imaging of the right foot was performed according to the standard protocol. Multiplanar CT image reconstructions were also generated. COMPARISON:  Radiographs dated 05/20/2016 FINDINGS: Bones/Joint/Cartilage There are fractures of the cuboid, medial, mid, and lateral cuneiforms, and of the bases of the second and third metatarsals. The plantar aspect of the bases of the second and third metatarsals are comminuted and impacted, most likely due to a hyper flexion injury of the midfoot. There is an impaction fracture of distal lateral aspect of the cuboid with an oblique fracture extending through the body of the cuboid. The  calcaneus and talus and navicular are intact. Distal metatarsals and phalanges are intact. Ligaments Suboptimally assessed by CT. Intraosseous ligaments are disrupted between the second and third metatarsals and the insertion of mid and plantar aspects of Lisfranc ligament is avulsed from the base of the second metatarsal which creates instability of the Lisfranc joint. IMPRESSION:  Multiple fractures of the midfoot with disruptions of the Lisfranc joints as described including between the medial cuneiform and the base of the second metatarsal, between the second and third metatarsal bases and between the third and fourth metatarsal bases. Electronically Signed   By: Francene BoyersJames  Maxwell M.D.   On: 05/20/2016 12:48   Dg Foot Complete Right  Result Date: 05/20/2016 CLINICAL DATA:  Right metatarsal pain since 0200 hours EXAM: RIGHT FOOT COMPLETE - 3+ VIEW COMPARISON:  None. FINDINGS: There is a mildly displaced fracture at the medial base of the second metatarsal. There is a subtle fracture at the medial base of the third metatarsal with slight lateral subluxation relative to the lateral cuneiform. There is irregularity along the distal lateral aspect of the cuboid concerning for a nondisplaced fracture. There is no evidence of arthropathy or other focal bone abnormality. Soft tissue swelling along the dorsal aspect of the foot. IMPRESSION: 1. Overall findings are concerning for Lisfranc fracture-subluxation off the right foot. Recommend orthopedic surgery consultation. Electronically Signed   By: Elige KoHetal  Patel   On: 05/20/2016 09:56     Past Medical/Family/Surgical/Social History: Medications & Allergies reviewed per EMR Patient Active Problem List   Diagnosis Date Noted  . Lisfranc dislocation, right, sequela 05/23/2016   No past medical history on file. No family history on file. No past surgical history on file. Social History   Occupational History  . Not on file.   Social History Main Topics  . Smoking status: Current Every Day Smoker  . Smokeless tobacco: Never Used  . Alcohol use Yes  . Drug use: No  . Sexual activity: Not on file

## 2016-05-23 NOTE — Progress Notes (Signed)
Unable to reach pt by phone, left pre-op instructions on pt's voicemail. 

## 2016-05-23 NOTE — Anesthesia Preprocedure Evaluation (Addendum)
Anesthesia Evaluation  Patient identified by MRN, date of birth, ID band Patient awake    Reviewed: Allergy & Precautions, H&P , NPO status , Patient's Chart, lab work & pertinent test results  Airway Mallampati: II  TM Distance: >3 FB Neck ROM: Full    Dental no notable dental hx. (+) Teeth Intact, Dental Advisory Given   Pulmonary Current Smoker,    Pulmonary exam normal breath sounds clear to auscultation       Cardiovascular negative cardio ROS   Rhythm:Regular Rate:Normal     Neuro/Psych negative neurological ROS  negative psych ROS   GI/Hepatic negative GI ROS, Neg liver ROS,   Endo/Other  negative endocrine ROS  Renal/GU negative Renal ROS  negative genitourinary   Musculoskeletal   Abdominal   Peds  Hematology negative hematology ROS (+)   Anesthesia Other Findings   Reproductive/Obstetrics negative OB ROS                            Anesthesia Physical Anesthesia Plan  ASA: II  Anesthesia Plan: General and Regional   Post-op Pain Management: GA combined w/ Regional for post-op pain   Induction: Intravenous  Airway Management Planned: LMA  Additional Equipment:   Intra-op Plan:   Post-operative Plan: Extubation in OR  Informed Consent: I have reviewed the patients History and Physical, chart, labs and discussed the procedure including the risks, benefits and alternatives for the proposed anesthesia with the patient or authorized representative who has indicated his/her understanding and acceptance.   Dental advisory given  Plan Discussed with: CRNA  Anesthesia Plan Comments:         Anesthesia Quick Evaluation

## 2016-05-23 NOTE — Progress Notes (Signed)
   Wound Care Note   Patient: Bill BoatmanRuben Zuniga           Date of Birth: Aug 07, 1985           MRN: [id number]             PCP: No PCP Per Patient Visit Date: 05/23/2016   Assessment & Plan: Visit Diagnoses:  1. Lisfranc dislocation, right, sequela     Plan: Plan for open reduction internal fixation and fusion of the base of the first metatarsal medial cuneiform and Lisfranc complex. Patient given strict instructions for discontinuing tobacco use for 3 months. Discussed the risk of wound dehiscence infection potential for amputation. Patient states he understands wish to proceed with surgery at this time.  Follow-Up Instructions: No Follow-up on file.  Orders:  No orders of the defined types were placed in this encounter.  No orders of the defined types were placed in this encounter.     Procedures: No notes on file   Clinical Data: No additional findings.   No images are attached to the encounter.   Subjective: No chief complaint on file.   HPI patient is a 30 year old gentleman was playing basketball and had a plantarflexion injury to his midfoot. Patient underwent a radiographs and CT scan which shows fracture across the Lisfranc complex.  Review of Systems negative for fever or chills negative for diabetes or hypertension positive for tobacco use  Miscellaneous:  -Home Health Care: Not applicable  -Physical Therapy: Not applicable  -Out of Work?: Patient does not work  -Financial risk analystWorker's Compensation Case?: N/A  -Additional Information: Patient given strict instructions to stop smoking.   Objective: Vital Signs: There were no vitals taken for this visit.  Physical Exam: On examination patient is alert oriented no adenopathy well-dressed normal affect normal respiratory effort he does have an antalgic gait is nonweightbearing on crutches. Patient does have a palpable dorsalis pedis pulse he has minimal amount of swelling there is no fracture blisterscellulitis no signs of  infection.  Specialty Comments: No specialty comments available.   PMFS History: Patient Active Problem List   Diagnosis Date Noted  . Lisfranc dislocation, right, sequela 05/23/2016   No past medical history on file.  No family history on file. No past surgical history on file. Social History   Occupational History  . Not on file.   Social History Main Topics  . Smoking status: Current Every Day Smoker  . Smokeless tobacco: Never Used  . Alcohol use Yes  . Drug use: No  . Sexual activity: Not on file

## 2016-05-24 ENCOUNTER — Ambulatory Visit (HOSPITAL_COMMUNITY): Payer: Self-pay | Admitting: Anesthesiology

## 2016-05-24 ENCOUNTER — Encounter (HOSPITAL_COMMUNITY)
Admission: RE | Disposition: A | Discharge status: Home / Self Care | Payer: Self-pay | Source: Ambulatory Visit | Attending: Orthopedic Surgery

## 2016-05-24 ENCOUNTER — Encounter (HOSPITAL_COMMUNITY): Payer: Self-pay | Admitting: *Deleted

## 2016-05-24 ENCOUNTER — Ambulatory Visit (HOSPITAL_COMMUNITY)
Admission: RE | Admit: 2016-05-24 | Discharge: 2016-05-24 | Disposition: A | Discharge status: Home / Self Care | Payer: Self-pay | Source: Ambulatory Visit | Attending: Orthopedic Surgery | Admitting: Orthopedic Surgery

## 2016-05-24 DIAGNOSIS — F17200 Nicotine dependence, unspecified, uncomplicated: Secondary | ICD-10-CM | POA: Insufficient documentation

## 2016-05-24 DIAGNOSIS — S92241A Displaced fracture of medial cuneiform of right foot, initial encounter for closed fracture: Secondary | ICD-10-CM | POA: Insufficient documentation

## 2016-05-24 DIAGNOSIS — Y998 Other external cause status: Secondary | ICD-10-CM | POA: Insufficient documentation

## 2016-05-24 DIAGNOSIS — Y9367 Activity, basketball: Secondary | ICD-10-CM | POA: Insufficient documentation

## 2016-05-24 DIAGNOSIS — X58XXXA Exposure to other specified factors, initial encounter: Secondary | ICD-10-CM | POA: Insufficient documentation

## 2016-05-24 DIAGNOSIS — Y92310 Basketball court as the place of occurrence of the external cause: Secondary | ICD-10-CM | POA: Insufficient documentation

## 2016-05-24 DIAGNOSIS — S93324S Dislocation of tarsometatarsal joint of right foot, sequela: Secondary | ICD-10-CM

## 2016-05-24 HISTORY — DX: Other specified health status: Z78.9

## 2016-05-24 HISTORY — PX: ORIF ANKLE FRACTURE: SHX5408

## 2016-05-24 SURGERY — OPEN REDUCTION INTERNAL FIXATION (ORIF) ANKLE FRACTURE
Anesthesia: Regional | Site: Foot | Laterality: Right

## 2016-05-24 MED ORDER — MIDAZOLAM HCL 2 MG/2ML IJ SOLN
INTRAMUSCULAR | Status: AC
  Filled 2016-05-24: qty 2

## 2016-05-24 MED ORDER — 0.9 % SODIUM CHLORIDE (POUR BTL) OPTIME
TOPICAL | Status: DC | PRN
  Administered 2016-05-24: 1000 mL

## 2016-05-24 MED ORDER — ROPIVACAINE HCL 5 MG/ML IJ SOLN
INTRAMUSCULAR | Status: DC | PRN
  Administered 2016-05-24: 35 mL via PERINEURAL

## 2016-05-24 MED ORDER — LACTATED RINGERS IV SOLN
INTRAVENOUS | Status: DC | PRN
  Administered 2016-05-24: 08:00:00 via INTRAVENOUS

## 2016-05-24 MED ORDER — PROPOFOL 10 MG/ML IV BOLUS
INTRAVENOUS | Status: AC
  Filled 2016-05-24: qty 20

## 2016-05-24 MED ORDER — HYDROMORPHONE HCL 1 MG/ML IJ SOLN: 0.2500 mg | INTRAMUSCULAR | Status: DC | PRN

## 2016-05-24 MED ORDER — MIDAZOLAM HCL 5 MG/5ML IJ SOLN
INTRAMUSCULAR | Status: DC | PRN
  Administered 2016-05-24: 2 mg via INTRAVENOUS

## 2016-05-24 MED ORDER — CEFAZOLIN SODIUM-DEXTROSE 2-4 GM/100ML-% IV SOLN
INTRAVENOUS | Status: AC
  Filled 2016-05-24: qty 100

## 2016-05-24 MED ORDER — FENTANYL CITRATE (PF) 100 MCG/2ML IJ SOLN
INTRAMUSCULAR | Status: DC
Start: 2016-05-24 — End: 2016-05-24
  Filled 2016-05-24: qty 2

## 2016-05-24 MED ORDER — DEXAMETHASONE SODIUM PHOSPHATE 10 MG/ML IJ SOLN
INTRAMUSCULAR | Status: DC | PRN
  Administered 2016-05-24: 10 mg via INTRAVENOUS

## 2016-05-24 MED ORDER — CHLORHEXIDINE GLUCONATE 4 % EX LIQD: 60.0000 mL | Freq: Once | CUTANEOUS | Status: DC

## 2016-05-24 MED ORDER — PROPOFOL 10 MG/ML IV BOLUS
INTRAVENOUS | Status: DC | PRN
  Administered 2016-05-24: 200 mg via INTRAVENOUS
  Administered 2016-05-24: 50 mg via INTRAVENOUS

## 2016-05-24 MED ORDER — FENTANYL CITRATE (PF) 100 MCG/2ML IJ SOLN
INTRAMUSCULAR | Status: DC | PRN
  Administered 2016-05-24 (×2): 50 ug via INTRAVENOUS

## 2016-05-24 MED ORDER — ONDANSETRON HCL 4 MG/2ML IJ SOLN
INTRAMUSCULAR | Status: DC | PRN
  Administered 2016-05-24: 4 mg via INTRAVENOUS

## 2016-05-24 MED ORDER — CEFAZOLIN SODIUM-DEXTROSE 2-4 GM/100ML-% IV SOLN
2.0000 g | INTRAVENOUS | Status: AC
  Administered 2016-05-24: 2 g via INTRAVENOUS
  Filled 2016-05-24: qty 100

## 2016-05-24 MED ORDER — LIDOCAINE HCL (CARDIAC) 20 MG/ML IV SOLN
INTRAVENOUS | Status: DC | PRN
  Administered 2016-05-24: 100 mg via INTRAVENOUS

## 2016-05-24 MED ORDER — FENTANYL CITRATE (PF) 100 MCG/2ML IJ SOLN
INTRAMUSCULAR | Status: AC
  Filled 2016-05-24: qty 2

## 2016-05-24 SURGICAL SUPPLY — 52 items
BANDAGE ESMARK 6X9 LF (GAUZE/BANDAGES/DRESSINGS) IMPLANT
BIT DRILL 3.2 QUICK MINI 300 (DRILL) ×3 IMPLANT
BIT DRILL CANN. 3.2 (BIT) ×3 IMPLANT
BNDG COHESIVE 4X5 TAN STRL (GAUZE/BANDAGES/DRESSINGS) ×3 IMPLANT
BNDG ESMARK 6X9 LF (GAUZE/BANDAGES/DRESSINGS)
BNDG GAUZE ELAST 4 BULKY (GAUZE/BANDAGES/DRESSINGS) ×3 IMPLANT
COVER MAYO STAND STRL (DRAPES) ×3 IMPLANT
COVER SURGICAL LIGHT HANDLE (MISCELLANEOUS) ×6 IMPLANT
CUFF TOURNIQUET SINGLE 34IN LL (TOURNIQUET CUFF) IMPLANT
CUFF TOURNIQUET SINGLE 44IN (TOURNIQUET CUFF) IMPLANT
DRAPE INCISE IOBAN 66X45 STRL (DRAPES) ×3 IMPLANT
DRAPE OEC MINIVIEW 54X84 (DRAPES) IMPLANT
DRAPE PROXIMA HALF (DRAPES) ×3 IMPLANT
DRAPE U-SHAPE 47X51 STRL (DRAPES) ×3 IMPLANT
DRSG ADAPTIC 3X8 NADH LF (GAUZE/BANDAGES/DRESSINGS) ×3 IMPLANT
DRSG PAD ABDOMINAL 8X10 ST (GAUZE/BANDAGES/DRESSINGS) ×3 IMPLANT
DURAPREP 26ML APPLICATOR (WOUND CARE) ×3 IMPLANT
ELECT REM PT RETURN 9FT ADLT (ELECTROSURGICAL) ×3
ELECTRODE REM PT RTRN 9FT ADLT (ELECTROSURGICAL) ×1 IMPLANT
GAUZE SPONGE 4X4 12PLY STRL (GAUZE/BANDAGES/DRESSINGS) ×3 IMPLANT
GLOVE BIO SURGEON STRL SZ 6.5 (GLOVE) ×2 IMPLANT
GLOVE BIO SURGEONS STRL SZ 6.5 (GLOVE) ×1
GLOVE BIOGEL PI IND STRL 6.5 (GLOVE) ×1 IMPLANT
GLOVE BIOGEL PI IND STRL 9 (GLOVE) ×1 IMPLANT
GLOVE BIOGEL PI INDICATOR 6.5 (GLOVE) ×2
GLOVE BIOGEL PI INDICATOR 9 (GLOVE) ×2
GLOVE ECLIPSE 6.5 STRL STRAW (GLOVE) ×3 IMPLANT
GLOVE SURG ORTHO 9.0 STRL STRW (GLOVE) ×3 IMPLANT
GOWN STRL REUS W/ TWL XL LVL3 (GOWN DISPOSABLE) ×3 IMPLANT
GOWN STRL REUS W/TWL XL LVL3 (GOWN DISPOSABLE) ×6
K-WIRE 1.6 (WIRE) ×3 IMPLANT
KIT BASIN OR (CUSTOM PROCEDURE TRAY) ×3 IMPLANT
KIT ROOM TURNOVER OR (KITS) ×3 IMPLANT
MANIFOLD NEPTUNE II (INSTRUMENTS) ×3 IMPLANT
NS IRRIG 1000ML POUR BTL (IV SOLUTION) ×3 IMPLANT
PACK ORTHO EXTREMITY (CUSTOM PROCEDURE TRAY) ×3 IMPLANT
PAD ABD 8X10 STRL (GAUZE/BANDAGES/DRESSINGS) ×3 IMPLANT
PAD ARMBOARD 7.5X6 YLW CONV (MISCELLANEOUS) ×6 IMPLANT
SCREW HEADLESS 4.5X54 (Screw) ×3 IMPLANT
SCREW HEADLESS COMP 4.5X42 (Screw) ×3 IMPLANT
SPONGE GAUZE 4X4 12PLY STER LF (GAUZE/BANDAGES/DRESSINGS) ×3 IMPLANT
SPONGE LAP 18X18 X RAY DECT (DISPOSABLE) ×3 IMPLANT
STAPLER VISISTAT 35W (STAPLE) IMPLANT
SUCTION FRAZIER HANDLE 10FR (MISCELLANEOUS) ×2
SUCTION TUBE FRAZIER 10FR DISP (MISCELLANEOUS) ×1 IMPLANT
SUT ETHILON 2 0 PSLX (SUTURE) IMPLANT
SUT VIC AB 2-0 CT1 27 (SUTURE) ×4
SUT VIC AB 2-0 CT1 TAPERPNT 27 (SUTURE) ×2 IMPLANT
TOWEL OR 17X24 6PK STRL BLUE (TOWEL DISPOSABLE) ×3 IMPLANT
TOWEL OR 17X26 10 PK STRL BLUE (TOWEL DISPOSABLE) ×3 IMPLANT
TUBE CONNECTING 12'X1/4 (SUCTIONS) ×1
TUBE CONNECTING 12X1/4 (SUCTIONS) ×2 IMPLANT

## 2016-05-24 NOTE — Anesthesia Postprocedure Evaluation (Signed)
Anesthesia Post Note  Patient: Bill Zuniga  Procedure(s) Performed: Procedure(s) (LRB): OPEN REDUCTION INTERNAL FIXATION (ORIF) LISFRANC FRACTURE RIGHT FOOT (Right)  Patient location during evaluation: PACU Anesthesia Type: General and Regional Level of consciousness: awake and alert Pain management: pain level controlled Vital Signs Assessment: post-procedure vital signs reviewed and stable Respiratory status: spontaneous breathing, nonlabored ventilation and respiratory function stable Cardiovascular status: blood pressure returned to baseline and stable Postop Assessment: no signs of nausea or vomiting Anesthetic complications: no    Last Vitals:  Vitals:   05/24/16 1046 05/24/16 1056  BP: 136/87 (!) 138/98  Pulse: 61 (!) 58  Resp:    Temp:      Last Pain:  Vitals:   05/24/16 0700  TempSrc: Oral        RLE Motor Response: No movement due to regional block (05/24/16 1056) RLE Sensation: No pain;No tingling;No numbness (05/24/16 1056)      Abbagale Goguen,W. EDMOND

## 2016-05-24 NOTE — Op Note (Signed)
05/24/2016  9:54 AM  PATIENT:  Bill Zuniga    PRE-OPERATIVE DIAGNOSIS:  Lisfranc Fracture/Dislocation Right Foot  POST-OPERATIVE DIAGNOSIS:  Same  PROCEDURE:  OPEN REDUCTION INTERNAL FIXATION (ORIF) LISFRANC FRACTURE RIGHT FOOT  SURGEON:  Nadara MustardMarcus V Wylder Macomber, MD  PHYSICIAN ASSISTANT:None ANESTHESIA:   General  PREOPERATIVE INDICATIONS:  Bill Zuniga is a  30 y.o. male with a diagnosis of Lisfranc Fracture/Dislocation Right Foot who failed conservative measures and elected for surgical management.    The risks benefits and alternatives were discussed with the patient preoperatively including but not limited to the risks of infection, bleeding, nerve injury, cardiopulmonary complications, the need for revision surgery, among others, and the patient was willing to proceed.  OPERATIVE IMPLANTS: 4.5 headless cannulated screws Synthes.  OPERATIVE FINDINGS: Comminution across the Lisfranc complex  OPERATIVE PROCEDURE: Patient brought the operating room and underwent general anesthetic. After adequate levels anesthesia obtained patient's right lower extremity was prepped using DuraPrep draped into a sterile field a timeout was called. A dorsal incision was made over the first webspace. Blunt dissection was carried down to the medial column. EHL tendon was retracted laterally. The base of the first metatarsal medial cuneiform was a comminuted fracture. The articular cartilage was debrided from the joint the joint was reduced and stabilized with a 4.5 cannulated headless screw. The Lisfranc complex was then reduced and a separate guidewire was inserted from the medial border of the medial cuneiform extending across the base of the second and third metatarsals. There was fractures involving both the base of the second and third metatarsals. C-arm fluoroscopy verified alignment. A screw was then placed from medial cuneiform to stabilize the base of the second metatarsal and the base of the third metatarsal.  C-arm fluoroscopy be verified alignment. The wound was irrigated with normal saline incision was closed using 2-0 nylon a sterile compressive dressing was applied patient was extubated taken the PACU in stable condition plan for discharge to home.

## 2016-05-24 NOTE — Addendum Note (Signed)
Addendum  created 05/24/16 1151 by Tillman AbideJoshua B Stesha Neyens, CRNA   Anesthesia Intra Flowsheets edited

## 2016-05-24 NOTE — Anesthesia Procedure Notes (Signed)
Procedure Name: LMA Insertion Date/Time: 05/24/2016 9:21 AM Performed by: Tillman AbideHAWKINS, Legaci Tarman B Pre-anesthesia Checklist: Patient identified, Emergency Drugs available, Suction available and Patient being monitored Patient Re-evaluated:Patient Re-evaluated prior to inductionOxygen Delivery Method: Circle System Utilized Preoxygenation: Pre-oxygenation with 100% oxygen Intubation Type: IV induction Ventilation: Mask ventilation without difficulty LMA: LMA inserted LMA Size: 5.0 Number of attempts: 1 Placement Confirmation: positive ETCO2 Tube secured with: Tape Dental Injury: Teeth and Oropharynx as per pre-operative assessment

## 2016-05-24 NOTE — H&P (Signed)
Bill Zuniga is an 30 y.o. male.   Chief Complaint: Lisfranc fracture dislocation right foot HPI: Patient is a 30 year old gentleman who was playing basketball landed with plantarflexion injury to the right foot sustaining a fracture dislocation through the Lisfranc complex.  No past medical history on file.  No past surgical history on file.  No family history on file. Social History:  reports that he has been smoking.  He has never used smokeless tobacco. He reports that he drinks alcohol. He reports that he does not use drugs.  Allergies: No Known Allergies  Medications Prior to Admission  Medication Sig Dispense Refill  . oxyCODONE-acetaminophen (PERCOCET/ROXICET) 5-325 MG tablet Take 1 tablet by mouth every 4 (four) hours as needed. 30 tablet 0    No results found for this or any previous visit (from the past 48 hour(s)). No results found.  Review of Systems  All other systems reviewed and are negative.   There were no vitals taken for this visit. Physical Exam   Examination patient alert oriented no adenopathy well-dressed normal respiratory effort he does have an antalgic gait he has good pulses he has swelling and tenderness to palpation across the Lisfranc joint radiographs and CT scan were reviewed which shows the complex fracture through the Lisfranc complex. Assessment/Plan Assessment: Fracture dislocation Lisfranc joint right foot.  Plan: We'll plan for open reduction internal fixation with fusion at through the midfoot. Risk and benefits were discussed including infection neurovascular injury persistent pain and need for additional surgery. Patient states she understands wish to proceed at this time.  Nadara MustardMarcus V Duda, MD 05/24/2016, 6:54 AM

## 2016-05-24 NOTE — Transfer of Care (Signed)
Immediate Anesthesia Transfer of Care Note  Patient: Bill Zuniga  Procedure(s) Performed: Procedure(s): OPEN REDUCTION INTERNAL FIXATION (ORIF) LISFRANC FRACTURE RIGHT FOOT (Right)  Patient Location: PACU  Anesthesia Type:General  Level of Consciousness: awake, alert , oriented and patient cooperative  Airway & Oxygen Therapy: Patient Spontanous Breathing and Patient connected to nasal cannula oxygen  Post-op Assessment: Report given to RN and Post -op Vital signs reviewed and stable  Post vital signs: Reviewed and stable  Last Vitals:  Vitals:   05/24/16 0700  BP: (!) 164/109  Pulse: 68  Resp: 18  Temp: 36.9 C    Last Pain:  Vitals:   05/24/16 0700  TempSrc: Oral      Patients Stated Pain Goal: 3 (05/24/16 0750)  Complications: No apparent anesthesia complications

## 2016-05-24 NOTE — Anesthesia Procedure Notes (Addendum)
Anesthesia Regional Block:  Popliteal block  Pre-Anesthetic Checklist: ,, timeout performed, Correct Patient, Correct Site, Correct Laterality, Correct Procedure, Correct Position, site marked, Risks and benefits discussed, pre-op evaluation,  At surgeon's request and post-op pain management  Laterality: Right  Prep: Maximum Sterile Barrier Precautions used, chloraprep       Needles:  Injection technique: Single-shot  Needle Type: Echogenic Stimulator Needle     Needle Length: 9cm 9 cm Needle Gauge: 21 and 21 G    Additional Needles:  Procedures: ultrasound guided (picture in chart) and nerve stimulator Popliteal block  Nerve Stimulator or Paresthesia:  Response: Peroneal,  Response: Tibial,   Additional Responses:   Narrative:  Start time: 05/24/2016 8:07 AM End time: 05/24/2016 8:17 AM Injection made incrementally with aspirations every 5 mL. Anesthesiologist: Gaynelle AduFITZGERALD, Lanetta Figuero  Additional Notes: 2% Lidocaine skin wheel. Saphenous block with 5cc of 0.5% Ropivicaine plain.

## 2016-05-25 ENCOUNTER — Encounter (HOSPITAL_COMMUNITY): Payer: Self-pay | Admitting: Orthopedic Surgery

## 2016-05-26 ENCOUNTER — Encounter (HOSPITAL_COMMUNITY): Payer: Self-pay | Admitting: Orthopedic Surgery

## 2016-05-31 ENCOUNTER — Ambulatory Visit (INDEPENDENT_AMBULATORY_CARE_PROVIDER_SITE_OTHER): Payer: Self-pay | Admitting: Orthopedic Surgery

## 2016-05-31 ENCOUNTER — Encounter (INDEPENDENT_AMBULATORY_CARE_PROVIDER_SITE_OTHER): Payer: Self-pay | Admitting: Orthopedic Surgery

## 2016-05-31 VITALS — Ht 70.0 in | Wt 215.0 lb

## 2016-05-31 DIAGNOSIS — S93324D Dislocation of tarsometatarsal joint of right foot, subsequent encounter: Secondary | ICD-10-CM

## 2016-05-31 MED ORDER — OXYCODONE-ACETAMINOPHEN 10-325 MG PO TABS: 1.0000 | ORAL_TABLET | Freq: Four times a day (QID) | ORAL | 0 refills | Status: DC | PRN

## 2016-05-31 NOTE — Progress Notes (Signed)
   Office Visit Note   Patient: Bill Zuniga           Date of Birth: 1985-10-20           MRN: [id number] Visit Date: 05/31/2016              Requested by: No referring provider defined for this encounter. PCP: No PCP Per Patient   Assessment & Plan: Visit Diagnoses:  1. Lisfranc dislocation, right, subsequent encounter     Plan: Continue nonweightbearing instructions given for dorsiflexion exercises of the ankle random range of motion for the subtalar joint discussed the importance of scar massage washing with soap and water elevation. Discussed the risk of a keloid formation discussed risk of decreased range of motion the ankle and subtalar joint without active stretching.  Follow-up in the office in 1 week with 3 view radiographs of the right foot at which time I anticipate we can remove the sutures.  Follow-Up Instructions: No Follow-up on file.   Orders:  No orders of the defined types were placed in this encounter.  No orders of the defined types were placed in this encounter.     Procedures: No procedures performed   Clinical Data: No additional findings.   Subjective: Chief Complaint  Patient presents with  . Right Foot - Routine Post Op    05/24/16 open reduction internal fixation lisfranc fracture dislocation    Patient is one week out from an ORIF lisfranc fracture dislocation of the right foot. He is weight bearing with crutches in a fracture boot. He states that he is taking Percocet 5/325 prn pain and that he is now out of the medication. " I need something else" States that he is in pain and " I have 5 kids and I need something to help me not be in pain while I am up and taking care of them"  Incision is well approximated and there is no redness, no drainage and small amount of swelling.    Review of Systems   Objective: Vital Signs: Ht 5\' 10"  (1.778 m)   Wt 215 lb (97.5 kg)   BMI 30.85 kg/m   Physical Exam Examination incision is well  approximated is no redness no satellite is no drainage no signs of infection. There are no blisters. 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   PMFS History: Patient Active Problem List   Diagnosis Date Noted  . Lisfranc dislocation, right, sequela 05/23/2016   Past Medical History:  Diagnosis Date  . Medical history non-contributory     No family history on file.  Past Surgical History:  Procedure Laterality Date  . ORIF ANKLE FRACTURE Right 05/24/2016   Procedure: OPEN REDUCTION INTERNAL FIXATION (ORIF) LISFRANC FRACTURE RIGHT FOOT;  Surgeon: Nadara MustardMarcus V Duda, MD;  Location: [facility] OR;  Service: Orthopedics;  Laterality: Right;   Social History   Occupational History  . Not on file.   Social History Main Topics  . Smoking status: Current Every Day Smoker  . Smokeless tobacco: Never Used  . Alcohol use Yes  . Drug use: No  . Sexual activity: Not on file

## 2016-06-08 ENCOUNTER — Ambulatory Visit (INDEPENDENT_AMBULATORY_CARE_PROVIDER_SITE_OTHER): Payer: Self-pay | Admitting: Orthopedic Surgery

## 2016-06-08 ENCOUNTER — Encounter (INDEPENDENT_AMBULATORY_CARE_PROVIDER_SITE_OTHER): Payer: Self-pay | Admitting: Orthopedic Surgery

## 2016-06-08 ENCOUNTER — Ambulatory Visit (INDEPENDENT_AMBULATORY_CARE_PROVIDER_SITE_OTHER): Payer: Self-pay

## 2016-06-08 VITALS — Ht 70.0 in | Wt 215.0 lb

## 2016-06-08 DIAGNOSIS — M79671 Pain in right foot: Secondary | ICD-10-CM

## 2016-06-08 MED ORDER — SULFAMETHOXAZOLE-TRIMETHOPRIM 800-160 MG PO TABS: 1.0000 | ORAL_TABLET | Freq: Two times a day (BID) | ORAL | 0 refills | Status: DC

## 2016-06-08 MED ORDER — IBUPROFEN 600 MG PO TABS: 600.0000 mg | ORAL_TABLET | Freq: Four times a day (QID) | ORAL | 0 refills | Status: DC | PRN

## 2016-06-08 MED ORDER — OXYCODONE-ACETAMINOPHEN 10-325 MG PO TABS: 1.0000 | ORAL_TABLET | Freq: Three times a day (TID) | ORAL | 0 refills | Status: DC | PRN

## 2016-06-08 NOTE — Progress Notes (Signed)
   Office Visit Note   Patient: Bill BoatmanRuben Zuniga           Date of Birth: Jun 08, 1986           MRN: [id number] Visit Date: 06/08/2016              Requested by: No referring provider defined for this encounter. PCP: No PCP Per Patient   Assessment & Plan: Visit Diagnoses:  1. Pain in right foot     Plan: Discussed importance of protective weight bearing. Will begin touch down weight bearing in fracture boot. If pain will back off and use crutches.   Follow-Up Instructions: Return in about 2 weeks (around 06/22/2016).   Orders:  Orders Placed This Encounter  Procedures  . XR Foot Complete Right   Meds ordered this encounter  Medications  . oxyCODONE-acetaminophen (PERCOCET) 10-325 MG tablet    Sig: Take 1 tablet by mouth every 8 (eight) hours as needed for pain.    Dispense:  40 tablet    Refill:  0  . sulfamethoxazole-trimethoprim (BACTRIM DS,SEPTRA DS) 800-160 MG tablet    Sig: Take 1 tablet by mouth 2 (two) times daily.    Dispense:  20 tablet    Refill:  0  . ibuprofen (ADVIL,MOTRIN) 600 MG tablet    Sig: Take 1 tablet (600 mg total) by mouth every 6 (six) hours as needed.    Dispense:  30 tablet    Refill:  0      Procedures: No procedures performed   Clinical Data: No additional findings.   Subjective: Chief Complaint  Patient presents with  . Right Foot - Routine Post Op    05/24/16 right foot ORIF lisfranc fracture dislocation    Patient is 2 weeks out form a left foot lisfranc fracture dislocation.   Incision is well healed. Has been doing scar massage. Has been full weight bearing with one crutch in fracture boot. Is concerned about a Behrendt lateral redness.   Review of Systems  Constitutional: Negative for chills and fever.     Objective: Vital Signs: Ht 5\' 10"  (1.778 m)   Wt 215 lb (97.5 kg)   BMI 30.85 kg/m   Physical Exam Moderate swelling right foot. Incision is well healed. Sutures were harvested today. No open areas or drainage. Does  have some erythema and warmth to dorsolateral foot. No ascending cellulitis.  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 No complicating features.     PMFS History: Patient Active Problem List   Diagnosis Date Noted  . Lisfranc dislocation, right, sequela 05/23/2016   Past Medical History:  Diagnosis Date  . Medical history non-contributory     No family history on file.  Past Surgical History:  Procedure Laterality Date  . ORIF ANKLE FRACTURE Right 05/24/2016   Procedure: OPEN REDUCTION INTERNAL FIXATION (ORIF) LISFRANC FRACTURE RIGHT FOOT;  Surgeon: Nadara MustardMarcus V Duda, MD;  Location: [facility] OR;  Service: Orthopedics;  Laterality: Right;   Social History   Occupational History  . Not on file.   Social History Main Topics  . Smoking status: Current Every Day Smoker  . Smokeless tobacco: Never Used  . Alcohol use Yes  . Drug use: No  . Sexual activity: Not on file

## 2016-06-21 ENCOUNTER — Ambulatory Visit (INDEPENDENT_AMBULATORY_CARE_PROVIDER_SITE_OTHER): Payer: MEDICAID

## 2016-06-21 ENCOUNTER — Ambulatory Visit (INDEPENDENT_AMBULATORY_CARE_PROVIDER_SITE_OTHER): Payer: Self-pay | Admitting: Family

## 2016-06-21 VITALS — Ht 70.0 in | Wt 215.0 lb

## 2016-06-21 DIAGNOSIS — M79671 Pain in right foot: Secondary | ICD-10-CM

## 2016-06-21 DIAGNOSIS — M79674 Pain in right toe(s): Secondary | ICD-10-CM

## 2016-06-21 DIAGNOSIS — S93324S Dislocation of tarsometatarsal joint of right foot, sequela: Secondary | ICD-10-CM

## 2016-06-21 NOTE — Progress Notes (Signed)
Office Visit Note   Patient: Bill BoatmanRuben Zuniga           Date of Birth: 1986-05-09           MRN: [id number] Visit Date: 06/21/2016              Requested by: No referring provider defined for this encounter. PCP: No PCP Per Patient   Assessment & Plan: Visit Diagnoses:  1. Pain in right foot   2. Pain of right great toe   3. Lisfranc dislocation, right, sequela     Plan: We will try uric acid level today. We'll call with results. Continue with ibuprofen as needed for pain. May advance weightbearing in fracture boot as tolerated. Anticipate releasing him to full weightbearing in regular shoe wear at next visit.  Follow-Up Instructions: Return in about 2 weeks (around 07/05/2016).   Orders:  Orders Placed This Encounter  Procedures  . XR Foot Complete Right  . Uric acid  . Hepatic function panel   No orders of the defined types were placed in this encounter.     Procedures: No procedures performed   Clinical Data: No additional findings.   Subjective: Chief Complaint  Patient presents with  . Right Foot - Routine Post Op    OPEN REDUCTION INTERNAL FIXATION (ORIF) LISFRANC FRACTURE RIGHT FOOT     The patient is a 30 year old gentleman. He is full weightbearing today in his fracture boot. Is 4 weeks status post Lisfranc reduction. States that the medial side of the foot is very sensitive and does have continued swelling reports that elevation normally helps with swelling and pain.  Complaining of pain over the MTP joint of the right great toe. He describes as burning.He does state that he has a cold intolerance as well and that he has to have the foot covered at all times that when he is not wearing a sock the pain is unbearable and that he is concerned about not being able to go without wearing socks.    Review of Systems  Constitutional: Negative for chills and fever.  Musculoskeletal: Positive for arthralgias.     Objective: Vital Signs: Ht 5\' 10"  (1.778 m)    Wt 215 lb (97.5 kg)   BMI 30.85 kg/m   Physical Exam  Constitutional: He is oriented to person, place, and time. He appears well-developed and well-nourished.  Pulmonary/Chest: Effort normal.  Musculoskeletal:  Incisions are well healed. Tenderness and sensitivity to the MTP joint right great toe. Full range of motion. This is pain-free.  Neurological: He is alert and oriented to person, place, and time.  Psychiatric: He has a normal mood and affect.  Nursing note reviewed.   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   PMFS History: Patient Active Problem List   Diagnosis Date Noted  . Lisfranc dislocation, right, sequela 05/23/2016   Past Medical History:  Diagnosis Date  . Medical history non-contributory     No family history on file.  Past Surgical History:  Procedure Laterality Date  . ORIF ANKLE FRACTURE Right 05/24/2016   Procedure: OPEN REDUCTION INTERNAL FIXATION (ORIF) LISFRANC FRACTURE RIGHT FOOT;  Surgeon: Nadara MustardMarcus V Duda, MD;  Location: [facility] OR;  Service: Orthopedics;  Laterality: Right;   Social History   Occupational History  . Not on file.   Social History Main Topics  . Smoking status: Current Every Day Smoker  .  Smokeless tobacco: Never Used  . Alcohol use Yes  . Drug use: No  . Sexual activity: Not on file

## 2016-06-22 ENCOUNTER — Telehealth (INDEPENDENT_AMBULATORY_CARE_PROVIDER_SITE_OTHER): Payer: Self-pay

## 2016-06-22 LAB — HEPATIC FUNCTION PANEL
ALBUMIN: 4.3 g/dL (ref 3.6–5.1)
ALK PHOS: 91 U/L (ref 40–115)
ALT: 19 U/L (ref 9–46)
AST: 17 U/L (ref 10–40)
BILIRUBIN INDIRECT: 0.3 mg/dL (ref 0.2–1.2)
BILIRUBIN TOTAL: 0.4 mg/dL (ref 0.2–1.2)
Bilirubin, Direct: 0.1 mg/dL (ref <=0.2)
Total Protein: 7.4 g/dL (ref 6.1–8.1)

## 2016-06-22 LAB — URIC ACID: Uric Acid, Serum: 5.8 mg/dL (ref 4.0–8.0)

## 2016-06-22 NOTE — Progress Notes (Signed)
Will you call and  advise, doesn't have gout. Continue with ibuprofen for pain. Will f/u as scheduled

## 2016-06-22 NOTE — Progress Notes (Signed)
Called and lm on vm to advise pt of results.

## 2016-06-23 NOTE — Telephone Encounter (Signed)
Advised of lab

## 2016-07-06 ENCOUNTER — Ambulatory Visit (INDEPENDENT_AMBULATORY_CARE_PROVIDER_SITE_OTHER): Payer: Self-pay | Admitting: Orthopedic Surgery

## 2017-01-22 ENCOUNTER — Emergency Department (HOSPITAL_BASED_OUTPATIENT_CLINIC_OR_DEPARTMENT_OTHER): Payer: Worker's Compensation

## 2017-01-22 ENCOUNTER — Emergency Department (HOSPITAL_BASED_OUTPATIENT_CLINIC_OR_DEPARTMENT_OTHER)
Admission: EM | Admit: 2017-01-22 | Discharge: 2017-01-22 | Disposition: A | Discharge status: Home / Self Care | Payer: Worker's Compensation | Attending: Emergency Medicine | Admitting: Emergency Medicine

## 2017-01-22 ENCOUNTER — Encounter (HOSPITAL_BASED_OUTPATIENT_CLINIC_OR_DEPARTMENT_OTHER): Payer: Self-pay | Admitting: *Deleted

## 2017-01-22 DIAGNOSIS — F17200 Nicotine dependence, unspecified, uncomplicated: Secondary | ICD-10-CM | POA: Diagnosis not present

## 2017-01-22 DIAGNOSIS — Y929 Unspecified place or not applicable: Secondary | ICD-10-CM | POA: Diagnosis not present

## 2017-01-22 DIAGNOSIS — S9031XA Contusion of right foot, initial encounter: Secondary | ICD-10-CM | POA: Diagnosis not present

## 2017-01-22 DIAGNOSIS — W319XXA Contact with unspecified machinery, initial encounter: Secondary | ICD-10-CM | POA: Insufficient documentation

## 2017-01-22 DIAGNOSIS — T148XXA Other injury of unspecified body region, initial encounter: Secondary | ICD-10-CM

## 2017-01-22 DIAGNOSIS — S9030XA Contusion of unspecified foot, initial encounter: Secondary | ICD-10-CM

## 2017-01-22 DIAGNOSIS — Y990 Civilian activity done for income or pay: Secondary | ICD-10-CM | POA: Diagnosis not present

## 2017-01-22 DIAGNOSIS — Y939 Activity, unspecified: Secondary | ICD-10-CM | POA: Insufficient documentation

## 2017-01-22 DIAGNOSIS — S99921A Unspecified injury of right foot, initial encounter: Secondary | ICD-10-CM | POA: Diagnosis present

## 2017-01-22 MED ORDER — KETOROLAC TROMETHAMINE 60 MG/2ML IM SOLN
60.0000 mg | Freq: Once | INTRAMUSCULAR | Status: AC
  Administered 2017-01-22: 60 mg via INTRAMUSCULAR
  Filled 2017-01-22: qty 2

## 2017-01-22 MED ORDER — DICLOFENAC SODIUM ER 100 MG PO TB24: 100.0000 mg | ORAL_TABLET | Freq: Every day | ORAL | 0 refills | Status: DC

## 2017-01-22 MED ORDER — NAPROXEN 500 MG PO TABS: 500.0000 mg | ORAL_TABLET | Freq: Two times a day (BID) | ORAL | 0 refills | Status: DC

## 2017-01-22 NOTE — ED Provider Notes (Signed)
[facility]   CSN: [id number] Arrival date & time: 01/22/17  0219     History   Chief Complaint Chief Complaint  Patient presents with  . Foot Injury    HPI Bill Zuniga is a 31 y.o. male.  The history is provided by the patient.  Foot Injury   The incident occurred 3 to 5 hours ago. The incident occurred at work. Injury mechanism: wheel of a cart hit foot. Pain location: right foot  The pain is severe. The pain has been constant since onset. Pertinent negatives include no numbness, no inability to bear weight, no loss of motion, no muscle weakness, no loss of sensation and no tingling. He reports no foreign bodies present. Nothing aggravates the symptoms. He has tried nothing for the symptoms. The treatment provided no relief.    Past Medical History:  Diagnosis Date  . Medical history non-contributory     Patient Active Problem List   Diagnosis Date Noted  . Lisfranc dislocation, right, sequela 05/23/2016    Past Surgical History:  Procedure Laterality Date  . ORIF ANKLE FRACTURE Right 05/24/2016   Procedure: OPEN REDUCTION INTERNAL FIXATION (ORIF) LISFRANC FRACTURE RIGHT FOOT;  Surgeon: Nadara Mustard, MD;  Location: [facility] OR;  Service: Orthopedics;  Laterality: Right;       Home Medications    Prior to Admission medications   Medication Sig Start Date End Date Taking? Authorizing Provider  Diclofenac Sodium CR (VOLTAREN-XR) 100 MG 24 hr tablet Take 1 tablet (100 mg total) by mouth daily. 01/22/17   Saleh Ulbrich, MD  ibuprofen (ADVIL,MOTRIN) 600 MG tablet Take 1 tablet (600 mg total) by mouth every 6 (six) hours as needed. 06/08/16   Adonis Huguenin, NP  naproxen (NAPROSYN) 500 MG tablet Take 1 tablet (500 mg total) by mouth 2 (two) times daily with a meal. 01/22/17   Koreena Joost, MD  oxyCODONE-acetaminophen (PERCOCET/ROXICET) 5-325 MG tablet Take 5-325 tablets by mouth every 8 (eight) hours. 05/24/16   [provider]    sulfamethoxazole-trimethoprim (BACTRIM DS,SEPTRA DS) 800-160 MG tablet Take 1 tablet by mouth 2 (two) times daily. 06/08/16   Adonis Huguenin, NP    Family History History reviewed. No pertinent family history.  Social History Social History  Substance Use Topics  . Smoking status: Current Every Day Smoker  . Smokeless tobacco: Never Used  . Alcohol use Yes     Allergies   Patient has no known allergies.   Review of Systems Review of Systems  Musculoskeletal: Positive for arthralgias. Negative for gait problem.  Neurological: Negative for tingling and numbness.  All other systems reviewed and are negative.    Physical Exam Updated Vital Signs BP (!) 155/99 (BP Location: Right Arm)   Pulse 77   Temp 98.7 F (37.1 C) (Oral)   Resp 18   Ht 5\' 10"  (1.778 m)   Wt 97.5 kg (215 lb)   SpO2 100%   BMI 30.85 kg/m   Physical Exam  Constitutional: He is oriented to person, place, and time. He appears well-developed and well-nourished. No distress.  HENT:  Head: Normocephalic and atraumatic.  Mouth/Throat: No oropharyngeal exudate.  Eyes: Conjunctivae and EOM are normal. Pupils are equal, round, and reactive to light.  Neck: Normal range of motion. Neck supple.  Cardiovascular: Normal rate, regular rhythm, normal heart sounds and intact distal pulses.   Pulmonary/Chest: Effort normal and breath sounds normal. He has no wheezes. He has no rales.  Abdominal: Soft. Bowel  sounds are normal. He exhibits no mass. There is no tenderness. There is no rebound and no guarding.  Musculoskeletal: Normal range of motion.       Right knee: Normal.       Right ankle: Normal. Achilles tendon normal.       Right lower leg: Normal.       Right foot: There is swelling. There is normal range of motion, no bony tenderness, normal capillary refill, no crepitus, no deformity and no laceration.       Feet:  Neurological: He is alert and oriented to person, place, and time.  Skin: Skin is warm  and dry. Capillary refill takes less than 2 seconds.     ED Treatments / Results   Vitals:   01/22/17 0240  BP: (!) 155/99  Pulse: 77  Resp: 18  Temp: 98.7 F (37.1 C)    Radiology Dg Foot Complete Right  Result Date: 01/22/2017 CLINICAL DATA:  31 y/o M; correlate for ran over right foot 2-3 hours ago. Right foot pain and swelling. EXAM: RIGHT FOOT COMPLETE - 3+ VIEW COMPARISON:  07/20/2016 foot radiographs. FINDINGS: No acute fracture or dislocation identified. Screw fixation from medial cuneiform through bases of the second and third metatarsals. Screw fixation of first metatarsal and medial cuneiform. Soft tissue swelling of the mid and forefoot. IMPRESSION: No acute fracture or dislocation identified. Soft tissue swelling of the mid and forefoot. Electronically Signed   By: Mitzi HansenLance  Furusawa-Stratton M.D.   On: 01/22/2017 03:37    Procedures Procedures (including critical care time)  Medications Ordered in ED Medications  ketorolac (TORADOL) injection 60 mg (60 mg Intramuscular Given 01/22/17 0301)       Final Clinical Impressions(s) / ED Diagnoses   Final diagnoses:  Contusion of foot, unspecified laterality, initial encounter  Hematoma    Strict return precautions given. Return immediately for fever >101, numbness, weakness worsening swelling,bleeding or any concerns. Follow up with Dr. Lajoyce Cornersuda for recheck in 48 hours. Patient and wife verbalize understanding.    The patient is nontoxic-appearing on exam and vital signs are within normal limits.   I have reviewed the triage vital signs and the nursing notes. Pertinent labs &imaging results that were available during my care of the patient were reviewed by me and considered in my medical decision making (see chart for details).  After history, exam, and medical workup I feel the patient has been appropriately medically screened and is safe for discharge home. Pertinent diagnoses were discussed with the patient.  Patient was given return precautions.     Kayon Dozier, MD 01/22/17 [id number]

## 2017-01-22 NOTE — ED Notes (Signed)
Patient transported to X-ray 

## 2017-01-22 NOTE — ED Notes (Signed)
PMS intact before and after. Pt tolerated well. All questions answered. 

## 2017-01-22 NOTE — ED Triage Notes (Addendum)
Here for w/c injury, fork lift ran over R foot at ~ 0030. Was wearing steel toed shoes. Pain, bruising and swelling noted to R 4-5th MT. H/o R foot surgery with pins 05/2016. Scar present. Skin intact.   Alert, NAD, calm, interactive, resps e/u, speaking in clear complete sentences, no dyspnea noted, skin W&D, VSS, (denies: sob, nausea, dizziness). EDP into room. [facility]

## 2017-01-31 ENCOUNTER — Ambulatory Visit (INDEPENDENT_AMBULATORY_CARE_PROVIDER_SITE_OTHER): Payer: Worker's Compensation | Admitting: Orthopedic Surgery

## 2017-01-31 DIAGNOSIS — S99921A Unspecified injury of right foot, initial encounter: Secondary | ICD-10-CM | POA: Diagnosis not present

## 2017-02-01 ENCOUNTER — Encounter (INDEPENDENT_AMBULATORY_CARE_PROVIDER_SITE_OTHER): Payer: Self-pay

## 2017-02-01 ENCOUNTER — Telehealth (INDEPENDENT_AMBULATORY_CARE_PROVIDER_SITE_OTHER): Payer: Self-pay

## 2017-02-01 NOTE — Progress Notes (Signed)
   Office Visit Note   Patient: Bill BoatmanRuben Scholler           Date of Birth: February 07, 1986           MRN: [id number] Visit Date: 01/31/2017              Requested by: No referring provider defined for this encounter. PCP: Patient, No Pcp Per  No chief complaint on file.     HPI: The patient is a [age] year old gentleman who presents today for evaluation of right foot injury this is a work-related injury. States a heavy cart hit the lateral side of his right foot. Radiographs performed in the emergency department were negative. He continues to have some swelling and hematoma over the forefoot. Today he is full weightbearing in slight on sandals has minimal complaints of pain. Wonders when he can return to work.  Does have a history of a open reduction with internal fixation Lisfranc fracture on the right foot as well this was in November of last year.  Assessment & Plan: Visit Diagnoses:  1. Injury of right foot, initial encounter     Plan: The patient will return to work in one week. Recommended that he continue with ice ibuprofen and elevation for swelling. Follow-up in office for any continued pain or issues.  Follow-Up Instructions: Return if symptoms worsen or fail to improve.   Ortho Exam  Patient is alert, oriented, no adenopathy, well-dressed, normal affect, normal respiratory effort. Right foot with moderate swelling dorsally and laterally there is a lateral hematoma. No open area and no drainage no cellulitis. Compartments are soft. Painless worse flexion and plantarflexion of the ankle. He states he is full weightbearing without issues right now.  Imaging: No results found.  Labs: Lab Results  Component Value Date   LABURIC 5.8 06/21/2016   REPTSTATUS 07/10/2013 FINAL 07/08/2013   CULT  07/08/2013    No Beta Hemolytic Streptococci Isolated Performed at [facility]    Orders:  No orders of the defined types were placed in this encounter.  No orders of the defined  types were placed in this encounter.    Procedures: No procedures performed  Clinical Data: No additional findings.  ROS:  All other systems negative, except as noted in the HPI. Review of Systems  Constitutional: Negative for chills and fever.  Musculoskeletal: Positive for arthralgias.  Skin: Positive for color change.    Objective: Vital Signs: There were no vitals taken for this visit.  Specialty Comments:  Works as a Sales executivecar detailer.Lisfranc fracture dislocation on 05/20/2016 while playing basketball  PMFS History: Patient Active Problem List   Diagnosis Date Noted  . Lisfranc dislocation, right, sequela 05/23/2016   Past Medical History:  Diagnosis Date  . Medical history non-contributory     No family history on file.  Past Surgical History:  Procedure Laterality Date  . ORIF ANKLE FRACTURE Right 05/24/2016   Procedure: OPEN REDUCTION INTERNAL FIXATION (ORIF) LISFRANC FRACTURE RIGHT FOOT;  Surgeon: Nadara MustardMarcus V Duda, MD;  Location: [facility] OR;  Service: Orthopedics;  Laterality: Right;   Social History   Occupational History  . Not on file.   Social History Main Topics  . Smoking status: Current Every Day Smoker  . Smokeless tobacco: Never Used  . Alcohol use Yes  . Drug use: No  . Sexual activity: Not on file

## 2017-02-01 NOTE — Telephone Encounter (Signed)
wc adj needs to know pts work status from todays appt and when can be rtw full duty?

## 2017-02-02 NOTE — Telephone Encounter (Signed)
Faxed work note and office note

## 2017-02-06 ENCOUNTER — Telehealth (INDEPENDENT_AMBULATORY_CARE_PROVIDER_SITE_OTHER): Payer: Self-pay

## 2017-02-06 ENCOUNTER — Encounter (HOSPITAL_BASED_OUTPATIENT_CLINIC_OR_DEPARTMENT_OTHER): Payer: Self-pay | Admitting: *Deleted

## 2017-02-06 ENCOUNTER — Emergency Department (HOSPITAL_BASED_OUTPATIENT_CLINIC_OR_DEPARTMENT_OTHER)
Admission: EM | Admit: 2017-02-06 | Discharge: 2017-02-06 | Disposition: A | Discharge status: Home / Self Care | Payer: BLUE CROSS/BLUE SHIELD | Attending: Emergency Medicine | Admitting: Emergency Medicine

## 2017-02-06 DIAGNOSIS — Z79899 Other long term (current) drug therapy: Secondary | ICD-10-CM | POA: Insufficient documentation

## 2017-02-06 DIAGNOSIS — R197 Diarrhea, unspecified: Secondary | ICD-10-CM | POA: Diagnosis not present

## 2017-02-06 DIAGNOSIS — R112 Nausea with vomiting, unspecified: Secondary | ICD-10-CM | POA: Diagnosis not present

## 2017-02-06 DIAGNOSIS — F17200 Nicotine dependence, unspecified, uncomplicated: Secondary | ICD-10-CM | POA: Diagnosis not present

## 2017-02-06 LAB — CBC WITH DIFFERENTIAL/PLATELET
Basophils Absolute: 0 K/uL (ref 0.0–0.1)
Basophils Relative: 0 %
Eosinophils Absolute: 0.2 K/uL (ref 0.0–0.7)
Eosinophils Relative: 2 %
HCT: 39.6 % (ref 39.0–52.0)
Hemoglobin: 14.2 g/dL (ref 13.0–17.0)
Lymphocytes Relative: 27 %
Lymphs Abs: 2.5 K/uL (ref 0.7–4.0)
MCH: 30.9 pg (ref 26.0–34.0)
MCHC: 35.9 g/dL (ref 30.0–36.0)
MCV: 86.3 fL (ref 78.0–100.0)
Monocytes Absolute: 0.7 K/uL (ref 0.1–1.0)
Monocytes Relative: 8 %
Neutro Abs: 5.6 K/uL (ref 1.7–7.7)
Neutrophils Relative %: 63 %
Platelets: 215 K/uL (ref 150–400)
RBC: 4.59 MIL/uL (ref 4.22–5.81)
RDW: 12.4 % (ref 11.5–15.5)
WBC: 9 K/uL (ref 4.0–10.5)

## 2017-02-06 LAB — COMPREHENSIVE METABOLIC PANEL WITH GFR
ALT: 23 U/L (ref 17–63)
AST: 21 U/L (ref 15–41)
Albumin: 4.1 g/dL (ref 3.5–5.0)
Alkaline Phosphatase: 88 U/L (ref 38–126)
Anion gap: 9 (ref 5–15)
BUN: 21 mg/dL — ABNORMAL HIGH (ref 6–20)
CO2: 26 mmol/L (ref 22–32)
Calcium: 9.1 mg/dL (ref 8.9–10.3)
Chloride: 101 mmol/L (ref 101–111)
Creatinine, Ser: 1.13 mg/dL (ref 0.61–1.24)
GFR calc Af Amer: >60 mL/min (ref >60)
GFR calc non Af Amer: >60 mL/min (ref >60)
Glucose, Bld: 113 mg/dL — ABNORMAL HIGH (ref 65–99)
Potassium: 3.3 mmol/L — ABNORMAL LOW (ref 3.5–5.1)
Sodium: 136 mmol/L (ref 135–145)
Total Bilirubin: 0.6 mg/dL (ref 0.3–1.2)
Total Protein: 7.8 g/dL (ref 6.5–8.1)

## 2017-02-06 MED ORDER — ONDANSETRON 4 MG PO TBDP: 4.0000 mg | ORAL_TABLET | Freq: Three times a day (TID) | ORAL | 0 refills | Status: DC | PRN

## 2017-02-06 MED ORDER — ONDANSETRON HCL 4 MG/2ML IJ SOLN
4.0000 mg | Freq: Once | INTRAMUSCULAR | Status: AC
  Administered 2017-02-06: 4 mg via INTRAVENOUS
  Filled 2017-02-06: qty 2

## 2017-02-06 MED ORDER — DICYCLOMINE HCL 10 MG/ML IM SOLN
20.0000 mg | Freq: Once | INTRAMUSCULAR | Status: AC
  Administered 2017-02-06: 20 mg via INTRAMUSCULAR
  Filled 2017-02-06: qty 2

## 2017-02-06 MED ORDER — SODIUM CHLORIDE 0.9 % IV BOLUS (SEPSIS)
1000.0000 mL | Freq: Once | INTRAVENOUS | Status: AC
  Administered 2017-02-06: 1000 mL via INTRAVENOUS

## 2017-02-06 MED FILL — ONDANSETRON ODT 4 MG TABLET: 4 | 4 days supply | Qty: 12 | Fill #0

## 2017-02-06 NOTE — ED Notes (Signed)
Pt given water for fluid challenge 

## 2017-02-06 NOTE — ED Triage Notes (Signed)
C/o abd pain onset last pm w n/v/d/ vomited x 3, diarrhea x 7

## 2017-02-06 NOTE — Discharge Instructions (Signed)
Keep yourself hydrated. Take the nausea medications as prescribed. Follow up with your doctor. Return to the ED if you develop worsening pain, vomiting, or any other concerns.

## 2017-02-06 NOTE — ED Notes (Signed)
Pt tolerated water without N/v. Pt sleeping at this time.

## 2017-02-06 NOTE — Telephone Encounter (Signed)
Faxed 01/31/17 office and work note to case mgr per her request

## 2017-02-06 NOTE — ED Provider Notes (Signed)
[facility]   CSN: [id number] Arrival date & time: 02/06/17  0559     History   Chief Complaint Chief Complaint  Patient presents with  . Abdominal Pain    HPI Bill BoatmanRuben Karabin is a 31 y.o. male.  Patient reports he developed periumbilical crampy abdominal pain last night. He thought he was hungry so he tried eat some chicken nuggets. After this he began having diarrhea associated with abdominal cramping and vomiting. Reports he had 3 episodes of vomiting overnight that was nonbilious and nonbloody. Also had about 7 episodes of loose stools that were nonbloody. Denies sick contacts. Denies fever. Denies recent travel or antibiotic use. Significant other at bedside reports patient's child was diagnosed with Escherichia coli several weeks ago but no longer has symptoms. No urinary symptoms. No previous abdominal surgeries.    The history is provided by the patient and the spouse.  Abdominal Pain   Associated symptoms include diarrhea, nausea and vomiting. Pertinent negatives include fever, dysuria, headaches, arthralgias and myalgias.    Past Medical History:  Diagnosis Date  . Medical history non-contributory     Patient Active Problem List   Diagnosis Date Noted  . Lisfranc dislocation, right, sequela 05/23/2016    Past Surgical History:  Procedure Laterality Date  . ORIF ANKLE FRACTURE Right 05/24/2016   Procedure: OPEN REDUCTION INTERNAL FIXATION (ORIF) LISFRANC FRACTURE RIGHT FOOT;  Surgeon: Nadara MustardMarcus V Duda, MD;  Location: [facility] OR;  Service: Orthopedics;  Laterality: Right;       Home Medications    Prior to Admission medications   Medication Sig Start Date End Date Taking? Authorizing Provider  Diclofenac Sodium CR (VOLTAREN-XR) 100 MG 24 hr tablet Take 1 tablet (100 mg total) by mouth daily. 01/22/17   Palumbo, April, MD  ibuprofen (ADVIL,MOTRIN) 600 MG tablet Take 1 tablet (600 mg total) by mouth every 6 (six) hours as needed. 06/08/16   Adonis HugueninZamora,  Erin R, NP  oxyCODONE-acetaminophen (PERCOCET/ROXICET) 5-325 MG tablet Take 5-325 tablets by mouth every 8 (eight) hours. 05/24/16   [provider]  sulfamethoxazole-trimethoprim (BACTRIM DS,SEPTRA DS) 800-160 MG tablet Take 1 tablet by mouth 2 (two) times daily. 06/08/16   Adonis HugueninZamora, Erin R, NP    Family History No family history on file.  Social History Social History  Substance Use Topics  . Smoking status: Current Every Day Smoker  . Smokeless tobacco: Never Used  . Alcohol use Yes     Allergies   Patient has no known allergies.   Review of Systems Review of Systems  Constitutional: Negative for activity change, appetite change and fever.  HENT: Negative for congestion and rhinorrhea.   Respiratory: Negative for cough, chest tightness and shortness of breath.   Gastrointestinal: Positive for abdominal pain, diarrhea, nausea and vomiting.  Genitourinary: Negative for dysuria and urgency.  Musculoskeletal: Negative for arthralgias and myalgias.  Skin: Negative for rash.  Neurological: Negative for dizziness, weakness and headaches.    all other systems are negative except as noted in the HPI and PMH.    Physical Exam Updated Vital Signs BP (!) 156/111 (BP Location: Left Arm)   Pulse 82   Temp 98.3 F (36.8 C) (Oral)   Resp 20   Ht 5\' 10"  (1.778 m)   Wt 97.5 kg (215 lb)   SpO2 100%   BMI 30.85 kg/m   Physical Exam  Constitutional: He is oriented to person, place, and time. He appears well-developed and well-nourished. No distress.  HENT:  Head:  Normocephalic and atraumatic.  Mouth/Throat: Oropharynx is clear and moist. No oropharyngeal exudate.  Eyes: Pupils are equal, round, and reactive to light. Conjunctivae and EOM are normal. No scleral icterus.  Neck: Normal range of motion. Neck supple.  No meningismus.  Cardiovascular: Normal rate, regular rhythm, normal heart sounds and intact distal pulses.   No murmur heard. Pulmonary/Chest: Effort normal  and breath sounds normal. No respiratory distress. He exhibits no tenderness.  Abdominal: Soft. There is tenderness. There is no rebound and no guarding.  Mild diffuse tenderness. No guarding or rebound. No RLQ tenderness.   Musculoskeletal: Normal range of motion. He exhibits no edema or tenderness.  Neurological: He is alert and oriented to person, place, and time. No cranial nerve deficit. He exhibits normal muscle tone. Coordination normal.  No ataxia on finger to nose bilaterally. No pronator drift. 5/5 strength throughout. CN 2-12 intact.Equal grip strength. Sensation intact.   Skin: Skin is warm. Capillary refill takes less than 2 seconds.  Psychiatric: He has a normal mood and affect. His behavior is normal.  Nursing note and vitals reviewed.    ED Treatments / Results  Labs (all labs ordered are listed, but only abnormal results are displayed) Labs Reviewed  COMPREHENSIVE METABOLIC PANEL - Abnormal; Notable for the following:       Result Value   Potassium 3.3 (*)    Glucose, Bld 113 (*)    BUN 21 (*)    All other components within normal limits  CBC WITH DIFFERENTIAL/PLATELET    EKG  EKG Interpretation None       Radiology No results found.  Procedures Procedures (including critical care time)  Medications Ordered in ED Medications  sodium chloride 0.9 % bolus 1,000 mL (not administered)  ondansetron (ZOFRAN) injection 4 mg (not administered)     Initial Impression / Assessment and Plan / ED Course  I have reviewed the triage vital signs and the nursing notes.  Pertinent labs & imaging results that were available during my care of the patient were reviewed by me and considered in my medical decision making (see chart for details).     Abdominal pain with vomiting and diarrhea that onset last night. Abdomen soft without peritoneal signs.   IVF, symptom control.   Suspect viral gastroenteritis. Labs reassuring. Abdomen benign.  Will hydrate,  antiemetics. Will be able to be discharged when tolerating PO.   Final Clinical Impressions(s) / ED Diagnoses   Final diagnoses:  Nausea vomiting and diarrhea    New Prescriptions New Prescriptions   No medications on file     Glynn Octave, MD 02/06/17 [phone]

## 2017-02-06 NOTE — ED Provider Notes (Signed)
Care assumed from Dr. Manus Gunningancour at shift change. Patient presents here with complaints of abdominal cramping, vomiting, diarrhea that started yesterday. All has been nonbloody. He was initially evaluated by Dr. Manus Gunningancour and is receiving IV fluids. He was also given anti-emetics and is now tolerating by mouth liquids. He is feeling better and appears appropriate for discharge. He will be given Zofran he can take as needed if symptoms worsen once he arrives home.   Bill Zuniga, Bill Slayton, MD 02/06/17 [phone]

## 2017-05-07 ENCOUNTER — Encounter (HOSPITAL_BASED_OUTPATIENT_CLINIC_OR_DEPARTMENT_OTHER): Payer: Self-pay

## 2017-05-07 ENCOUNTER — Emergency Department (HOSPITAL_BASED_OUTPATIENT_CLINIC_OR_DEPARTMENT_OTHER)
Admission: EM | Admit: 2017-05-07 | Discharge: 2017-05-07 | Disposition: A | Discharge status: Home / Self Care | Payer: BLUE CROSS/BLUE SHIELD | Attending: Emergency Medicine | Admitting: Emergency Medicine

## 2017-05-07 DIAGNOSIS — F17210 Nicotine dependence, cigarettes, uncomplicated: Secondary | ICD-10-CM | POA: Insufficient documentation

## 2017-05-07 DIAGNOSIS — M542 Cervicalgia: Secondary | ICD-10-CM | POA: Diagnosis present

## 2017-05-07 DIAGNOSIS — R079 Chest pain, unspecified: Secondary | ICD-10-CM | POA: Insufficient documentation

## 2017-05-07 DIAGNOSIS — R51 Headache: Secondary | ICD-10-CM | POA: Diagnosis not present

## 2017-05-07 MED ORDER — IBUPROFEN 400 MG PO TABS: 600.0000 mg | ORAL_TABLET | Freq: Once | ORAL | Status: DC

## 2017-05-07 MED ORDER — CYCLOBENZAPRINE HCL 10 MG PO TABS: 10.0000 mg | ORAL_TABLET | Freq: Two times a day (BID) | ORAL | 0 refills | Status: AC | PRN

## 2017-05-07 MED ORDER — ACETAMINOPHEN 325 MG PO TABS
650.0000 mg | ORAL_TABLET | Freq: Once | ORAL | Status: AC
  Administered 2017-05-07: 650 mg via ORAL
  Filled 2017-05-07: qty 2

## 2017-05-07 NOTE — ED Notes (Signed)
EDp at bedside.  

## 2017-05-07 NOTE — Discharge Instructions (Signed)
Please read attached information regarding your condition. Take Tylenol and Flexeril as directed for headache and muscle pains. Continue home medications as previously prescribed. Return to ED for worsening chest pain, increasing headache, vision changes, numbness in legs, trouble breathing, additional injury.
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
## 2017-05-07 NOTE — ED Notes (Signed)
Pt verbalizes understanding of d/c instructions and denies any further needs at this time. 
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
